# Patient Record
Sex: Female | Born: 1935 | Race: White | Hispanic: No | Marital: Single | Smoking: Never smoker
Health system: Southern US, Community
[De-identification: ages and names within clinical notes are randomized; demographics above are authoritative.]

## PROBLEM LIST (undated history)

## (undated) DIAGNOSIS — Z8679 Personal history of other diseases of the circulatory system: Secondary | ICD-10-CM

## (undated) DIAGNOSIS — I219 Acute myocardial infarction, unspecified: Secondary | ICD-10-CM

## (undated) DIAGNOSIS — I351 Nonrheumatic aortic (valve) insufficiency: Secondary | ICD-10-CM

## (undated) DIAGNOSIS — R600 Localized edema: Secondary | ICD-10-CM

## (undated) DIAGNOSIS — I872 Venous insufficiency (chronic) (peripheral): Secondary | ICD-10-CM

## (undated) DIAGNOSIS — F0280 Dementia in other diseases classified elsewhere without behavioral disturbance: Secondary | ICD-10-CM

## (undated) DIAGNOSIS — I480 Paroxysmal atrial fibrillation: Secondary | ICD-10-CM

## (undated) DIAGNOSIS — I442 Atrioventricular block, complete: Secondary | ICD-10-CM

## (undated) DIAGNOSIS — G309 Alzheimer's disease, unspecified: Secondary | ICD-10-CM

## (undated) DIAGNOSIS — S2231XA Fracture of one rib, right side, initial encounter for closed fracture: Secondary | ICD-10-CM

## (undated) HISTORY — DX: Personal history of other diseases of the circulatory system: Z86.79

## (undated) HISTORY — PX: PACEMAKER INSERTION: SHX728

## (undated) HISTORY — DX: Atrioventricular block, complete: I44.2

## (undated) HISTORY — DX: Venous insufficiency (chronic) (peripheral): I87.2

## (undated) HISTORY — DX: Localized edema: R60.0

## (undated) HISTORY — DX: Paroxysmal atrial fibrillation: I48.0

## (undated) HISTORY — DX: Dementia in other diseases classified elsewhere, unspecified severity, without behavioral disturbance, psychotic disturbance, mood disturbance, and anxiety: F02.80

## (undated) HISTORY — DX: Nonrheumatic aortic (valve) insufficiency: I35.1

## (undated) HISTORY — DX: Fracture of one rib, right side, initial encounter for closed fracture: S22.31XA

## (undated) HISTORY — PX: KNEE ARTHROSCOPY: SUR90

## (undated) HISTORY — PX: TOTAL SHOULDER REPLACEMENT: SUR1217

## (undated) HISTORY — DX: Acute myocardial infarction, unspecified: I21.9

## (undated) HISTORY — DX: Alzheimer's disease, unspecified: G30.9

---

## 2011-10-25 DIAGNOSIS — Z7901 Long term (current) use of anticoagulants: Secondary | ICD-10-CM | POA: Diagnosis not present

## 2011-10-25 DIAGNOSIS — I4891 Unspecified atrial fibrillation: Secondary | ICD-10-CM | POA: Diagnosis not present

## 2011-10-25 DIAGNOSIS — J310 Chronic rhinitis: Secondary | ICD-10-CM | POA: Diagnosis not present

## 2011-11-02 DIAGNOSIS — M25569 Pain in unspecified knee: Secondary | ICD-10-CM | POA: Diagnosis not present

## 2011-11-11 DIAGNOSIS — M1710 Unilateral primary osteoarthritis, unspecified knee: Secondary | ICD-10-CM | POA: Diagnosis not present

## 2011-11-14 DIAGNOSIS — Z95810 Presence of automatic (implantable) cardiac defibrillator: Secondary | ICD-10-CM | POA: Diagnosis not present

## 2011-11-14 DIAGNOSIS — I059 Rheumatic mitral valve disease, unspecified: Secondary | ICD-10-CM | POA: Diagnosis not present

## 2011-11-14 DIAGNOSIS — Z01810 Encounter for preprocedural cardiovascular examination: Secondary | ICD-10-CM | POA: Diagnosis not present

## 2011-11-14 DIAGNOSIS — I359 Nonrheumatic aortic valve disorder, unspecified: Secondary | ICD-10-CM | POA: Diagnosis not present

## 2011-11-14 DIAGNOSIS — I270 Primary pulmonary hypertension: Secondary | ICD-10-CM | POA: Diagnosis not present

## 2011-11-14 DIAGNOSIS — I10 Essential (primary) hypertension: Secondary | ICD-10-CM | POA: Diagnosis not present

## 2011-11-17 DIAGNOSIS — R0602 Shortness of breath: Secondary | ICD-10-CM | POA: Diagnosis not present

## 2011-11-22 DIAGNOSIS — Z7901 Long term (current) use of anticoagulants: Secondary | ICD-10-CM | POA: Diagnosis not present

## 2011-11-22 DIAGNOSIS — I4891 Unspecified atrial fibrillation: Secondary | ICD-10-CM | POA: Diagnosis not present

## 2011-11-24 DIAGNOSIS — Z95810 Presence of automatic (implantable) cardiac defibrillator: Secondary | ICD-10-CM | POA: Diagnosis not present

## 2011-11-24 DIAGNOSIS — Z7901 Long term (current) use of anticoagulants: Secondary | ICD-10-CM | POA: Diagnosis not present

## 2011-11-24 DIAGNOSIS — I4891 Unspecified atrial fibrillation: Secondary | ICD-10-CM | POA: Diagnosis not present

## 2011-11-24 DIAGNOSIS — Z01810 Encounter for preprocedural cardiovascular examination: Secondary | ICD-10-CM | POA: Diagnosis not present

## 2011-11-24 DIAGNOSIS — I428 Other cardiomyopathies: Secondary | ICD-10-CM | POA: Diagnosis not present

## 2012-01-03 DIAGNOSIS — I4891 Unspecified atrial fibrillation: Secondary | ICD-10-CM | POA: Diagnosis not present

## 2012-01-03 DIAGNOSIS — Z7901 Long term (current) use of anticoagulants: Secondary | ICD-10-CM | POA: Diagnosis not present

## 2012-01-27 DIAGNOSIS — Z95810 Presence of automatic (implantable) cardiac defibrillator: Secondary | ICD-10-CM | POA: Diagnosis not present

## 2012-02-17 DIAGNOSIS — J310 Chronic rhinitis: Secondary | ICD-10-CM | POA: Diagnosis not present

## 2012-03-22 DIAGNOSIS — R609 Edema, unspecified: Secondary | ICD-10-CM | POA: Diagnosis not present

## 2012-03-22 DIAGNOSIS — I517 Cardiomegaly: Secondary | ICD-10-CM | POA: Diagnosis not present

## 2012-03-22 DIAGNOSIS — Z7901 Long term (current) use of anticoagulants: Secondary | ICD-10-CM | POA: Diagnosis not present

## 2012-03-26 DIAGNOSIS — R609 Edema, unspecified: Secondary | ICD-10-CM | POA: Diagnosis not present

## 2012-03-26 DIAGNOSIS — I509 Heart failure, unspecified: Secondary | ICD-10-CM | POA: Diagnosis not present

## 2012-05-02 DIAGNOSIS — T7840XA Allergy, unspecified, initial encounter: Secondary | ICD-10-CM | POA: Diagnosis not present

## 2012-05-02 DIAGNOSIS — I509 Heart failure, unspecified: Secondary | ICD-10-CM | POA: Diagnosis not present

## 2012-05-02 DIAGNOSIS — M25519 Pain in unspecified shoulder: Secondary | ICD-10-CM | POA: Diagnosis not present

## 2012-05-02 DIAGNOSIS — Z7901 Long term (current) use of anticoagulants: Secondary | ICD-10-CM | POA: Diagnosis not present

## 2012-05-02 DIAGNOSIS — R609 Edema, unspecified: Secondary | ICD-10-CM | POA: Diagnosis not present

## 2012-05-02 DIAGNOSIS — M25569 Pain in unspecified knee: Secondary | ICD-10-CM | POA: Diagnosis not present

## 2012-05-17 DIAGNOSIS — K649 Unspecified hemorrhoids: Secondary | ICD-10-CM | POA: Diagnosis not present

## 2012-05-17 DIAGNOSIS — R197 Diarrhea, unspecified: Secondary | ICD-10-CM | POA: Diagnosis not present

## 2012-05-17 DIAGNOSIS — Z7901 Long term (current) use of anticoagulants: Secondary | ICD-10-CM | POA: Diagnosis not present

## 2012-05-23 DIAGNOSIS — I359 Nonrheumatic aortic valve disorder, unspecified: Secondary | ICD-10-CM | POA: Diagnosis not present

## 2012-05-23 DIAGNOSIS — I10 Essential (primary) hypertension: Secondary | ICD-10-CM | POA: Diagnosis not present

## 2012-05-23 DIAGNOSIS — I4891 Unspecified atrial fibrillation: Secondary | ICD-10-CM | POA: Diagnosis not present

## 2012-05-23 DIAGNOSIS — Z95810 Presence of automatic (implantable) cardiac defibrillator: Secondary | ICD-10-CM | POA: Diagnosis not present

## 2012-05-23 DIAGNOSIS — I428 Other cardiomyopathies: Secondary | ICD-10-CM | POA: Diagnosis not present

## 2012-05-30 DIAGNOSIS — R6889 Other general symptoms and signs: Secondary | ICD-10-CM | POA: Diagnosis not present

## 2012-05-30 DIAGNOSIS — J8410 Pulmonary fibrosis, unspecified: Secondary | ICD-10-CM | POA: Diagnosis not present

## 2012-05-30 DIAGNOSIS — M79609 Pain in unspecified limb: Secondary | ICD-10-CM | POA: Diagnosis not present

## 2012-05-30 DIAGNOSIS — M1710 Unilateral primary osteoarthritis, unspecified knee: Secondary | ICD-10-CM | POA: Diagnosis not present

## 2012-05-30 DIAGNOSIS — M949 Disorder of cartilage, unspecified: Secondary | ICD-10-CM | POA: Diagnosis not present

## 2012-05-30 DIAGNOSIS — I517 Cardiomegaly: Secondary | ICD-10-CM | POA: Diagnosis not present

## 2012-05-30 DIAGNOSIS — M899 Disorder of bone, unspecified: Secondary | ICD-10-CM | POA: Diagnosis not present

## 2012-05-30 DIAGNOSIS — Z01818 Encounter for other preprocedural examination: Secondary | ICD-10-CM | POA: Diagnosis not present

## 2012-05-30 DIAGNOSIS — R9389 Abnormal findings on diagnostic imaging of other specified body structures: Secondary | ICD-10-CM | POA: Diagnosis not present

## 2012-06-14 DIAGNOSIS — F0391 Unspecified dementia with behavioral disturbance: Secondary | ICD-10-CM | POA: Diagnosis not present

## 2012-06-14 DIAGNOSIS — Z5189 Encounter for other specified aftercare: Secondary | ICD-10-CM | POA: Diagnosis not present

## 2012-06-14 DIAGNOSIS — M1710 Unilateral primary osteoarthritis, unspecified knee: Secondary | ICD-10-CM | POA: Diagnosis present

## 2012-06-14 DIAGNOSIS — Z471 Aftercare following joint replacement surgery: Secondary | ICD-10-CM | POA: Diagnosis not present

## 2012-06-14 DIAGNOSIS — Z299 Encounter for prophylactic measures, unspecified: Secondary | ICD-10-CM | POA: Diagnosis not present

## 2012-06-14 DIAGNOSIS — D62 Acute posthemorrhagic anemia: Secondary | ICD-10-CM | POA: Diagnosis not present

## 2012-06-14 DIAGNOSIS — F0390 Unspecified dementia without behavioral disturbance: Secondary | ICD-10-CM | POA: Diagnosis present

## 2012-06-14 DIAGNOSIS — I4891 Unspecified atrial fibrillation: Secondary | ICD-10-CM | POA: Diagnosis present

## 2012-06-14 DIAGNOSIS — IMO0002 Reserved for concepts with insufficient information to code with codable children: Secondary | ICD-10-CM | POA: Diagnosis not present

## 2012-06-14 DIAGNOSIS — G4700 Insomnia, unspecified: Secondary | ICD-10-CM | POA: Diagnosis present

## 2012-06-14 DIAGNOSIS — Z882 Allergy status to sulfonamides status: Secondary | ICD-10-CM | POA: Diagnosis not present

## 2012-06-14 DIAGNOSIS — R413 Other amnesia: Secondary | ICD-10-CM | POA: Diagnosis not present

## 2012-06-14 DIAGNOSIS — Z7901 Long term (current) use of anticoagulants: Secondary | ICD-10-CM | POA: Diagnosis not present

## 2012-06-14 DIAGNOSIS — K5900 Constipation, unspecified: Secondary | ICD-10-CM | POA: Diagnosis not present

## 2012-06-14 DIAGNOSIS — M25569 Pain in unspecified knee: Secondary | ICD-10-CM | POA: Diagnosis not present

## 2012-06-14 DIAGNOSIS — Z95810 Presence of automatic (implantable) cardiac defibrillator: Secondary | ICD-10-CM | POA: Diagnosis not present

## 2012-06-14 DIAGNOSIS — Z888 Allergy status to other drugs, medicaments and biological substances status: Secondary | ICD-10-CM | POA: Diagnosis not present

## 2012-06-14 DIAGNOSIS — Z4789 Encounter for other orthopedic aftercare: Secondary | ICD-10-CM | POA: Diagnosis not present

## 2012-06-14 DIAGNOSIS — I10 Essential (primary) hypertension: Secondary | ICD-10-CM | POA: Diagnosis present

## 2012-06-14 DIAGNOSIS — M949 Disorder of cartilage, unspecified: Secondary | ICD-10-CM | POA: Diagnosis present

## 2012-06-14 DIAGNOSIS — Z96659 Presence of unspecified artificial knee joint: Secondary | ICD-10-CM | POA: Diagnosis not present

## 2012-06-14 DIAGNOSIS — F329 Major depressive disorder, single episode, unspecified: Secondary | ICD-10-CM | POA: Diagnosis not present

## 2012-06-14 DIAGNOSIS — G8929 Other chronic pain: Secondary | ICD-10-CM | POA: Diagnosis not present

## 2012-06-14 DIAGNOSIS — I509 Heart failure, unspecified: Secondary | ICD-10-CM | POA: Diagnosis present

## 2012-06-22 DIAGNOSIS — Z471 Aftercare following joint replacement surgery: Secondary | ICD-10-CM | POA: Diagnosis not present

## 2012-06-22 DIAGNOSIS — Z96659 Presence of unspecified artificial knee joint: Secondary | ICD-10-CM | POA: Diagnosis not present

## 2012-06-26 DIAGNOSIS — I10 Essential (primary) hypertension: Secondary | ICD-10-CM | POA: Diagnosis not present

## 2012-06-26 DIAGNOSIS — R5381 Other malaise: Secondary | ICD-10-CM | POA: Diagnosis not present

## 2012-06-26 DIAGNOSIS — I4891 Unspecified atrial fibrillation: Secondary | ICD-10-CM | POA: Diagnosis not present

## 2012-06-26 DIAGNOSIS — G4700 Insomnia, unspecified: Secondary | ICD-10-CM | POA: Diagnosis not present

## 2012-06-26 DIAGNOSIS — F329 Major depressive disorder, single episode, unspecified: Secondary | ICD-10-CM | POA: Diagnosis not present

## 2012-06-26 DIAGNOSIS — K5900 Constipation, unspecified: Secondary | ICD-10-CM | POA: Diagnosis not present

## 2012-06-26 DIAGNOSIS — F0391 Unspecified dementia with behavioral disturbance: Secondary | ICD-10-CM | POA: Diagnosis not present

## 2012-06-26 DIAGNOSIS — Z5189 Encounter for other specified aftercare: Secondary | ICD-10-CM | POA: Diagnosis not present

## 2012-06-26 DIAGNOSIS — R413 Other amnesia: Secondary | ICD-10-CM | POA: Diagnosis not present

## 2012-06-26 DIAGNOSIS — Z4789 Encounter for other orthopedic aftercare: Secondary | ICD-10-CM | POA: Diagnosis not present

## 2012-06-26 DIAGNOSIS — F0390 Unspecified dementia without behavioral disturbance: Secondary | ICD-10-CM | POA: Diagnosis not present

## 2012-06-26 DIAGNOSIS — G8929 Other chronic pain: Secondary | ICD-10-CM | POA: Diagnosis not present

## 2012-06-26 DIAGNOSIS — Z471 Aftercare following joint replacement surgery: Secondary | ICD-10-CM | POA: Diagnosis not present

## 2012-06-26 DIAGNOSIS — I509 Heart failure, unspecified: Secondary | ICD-10-CM | POA: Diagnosis not present

## 2012-06-26 DIAGNOSIS — M1990 Unspecified osteoarthritis, unspecified site: Secondary | ICD-10-CM | POA: Diagnosis not present

## 2012-06-26 DIAGNOSIS — Z299 Encounter for prophylactic measures, unspecified: Secondary | ICD-10-CM | POA: Diagnosis not present

## 2012-06-26 DIAGNOSIS — Z7901 Long term (current) use of anticoagulants: Secondary | ICD-10-CM | POA: Diagnosis not present

## 2012-06-27 DIAGNOSIS — M1990 Unspecified osteoarthritis, unspecified site: Secondary | ICD-10-CM | POA: Diagnosis not present

## 2012-06-27 DIAGNOSIS — I509 Heart failure, unspecified: Secondary | ICD-10-CM | POA: Diagnosis not present

## 2012-06-27 DIAGNOSIS — Z471 Aftercare following joint replacement surgery: Secondary | ICD-10-CM | POA: Diagnosis not present

## 2012-06-27 DIAGNOSIS — I4891 Unspecified atrial fibrillation: Secondary | ICD-10-CM | POA: Diagnosis not present

## 2012-06-28 DIAGNOSIS — Z471 Aftercare following joint replacement surgery: Secondary | ICD-10-CM | POA: Diagnosis not present

## 2012-06-28 DIAGNOSIS — Z7901 Long term (current) use of anticoagulants: Secondary | ICD-10-CM | POA: Diagnosis not present

## 2012-07-03 DIAGNOSIS — Z7901 Long term (current) use of anticoagulants: Secondary | ICD-10-CM | POA: Diagnosis not present

## 2012-07-03 DIAGNOSIS — Z471 Aftercare following joint replacement surgery: Secondary | ICD-10-CM | POA: Diagnosis not present

## 2012-07-10 DIAGNOSIS — F0390 Unspecified dementia without behavioral disturbance: Secondary | ICD-10-CM | POA: Diagnosis not present

## 2012-07-10 DIAGNOSIS — R5381 Other malaise: Secondary | ICD-10-CM | POA: Diagnosis not present

## 2012-07-10 DIAGNOSIS — I10 Essential (primary) hypertension: Secondary | ICD-10-CM | POA: Diagnosis not present

## 2012-07-10 DIAGNOSIS — Z471 Aftercare following joint replacement surgery: Secondary | ICD-10-CM | POA: Diagnosis not present

## 2012-07-17 DIAGNOSIS — F0390 Unspecified dementia without behavioral disturbance: Secondary | ICD-10-CM | POA: Diagnosis not present

## 2012-07-17 DIAGNOSIS — Z471 Aftercare following joint replacement surgery: Secondary | ICD-10-CM | POA: Diagnosis not present

## 2012-07-17 DIAGNOSIS — I10 Essential (primary) hypertension: Secondary | ICD-10-CM | POA: Diagnosis not present

## 2012-07-17 DIAGNOSIS — R5381 Other malaise: Secondary | ICD-10-CM | POA: Diagnosis not present

## 2012-07-19 DIAGNOSIS — Z471 Aftercare following joint replacement surgery: Secondary | ICD-10-CM | POA: Diagnosis not present

## 2012-07-19 DIAGNOSIS — F0390 Unspecified dementia without behavioral disturbance: Secondary | ICD-10-CM | POA: Diagnosis not present

## 2012-07-19 DIAGNOSIS — Z95810 Presence of automatic (implantable) cardiac defibrillator: Secondary | ICD-10-CM | POA: Diagnosis not present

## 2012-07-19 DIAGNOSIS — Z96659 Presence of unspecified artificial knee joint: Secondary | ICD-10-CM | POA: Diagnosis not present

## 2012-07-19 DIAGNOSIS — I10 Essential (primary) hypertension: Secondary | ICD-10-CM | POA: Diagnosis not present

## 2012-07-19 DIAGNOSIS — Z950 Presence of cardiac pacemaker: Secondary | ICD-10-CM | POA: Diagnosis not present

## 2012-07-23 DIAGNOSIS — Z95810 Presence of automatic (implantable) cardiac defibrillator: Secondary | ICD-10-CM | POA: Diagnosis not present

## 2012-07-23 DIAGNOSIS — Z950 Presence of cardiac pacemaker: Secondary | ICD-10-CM | POA: Diagnosis not present

## 2012-07-23 DIAGNOSIS — Z471 Aftercare following joint replacement surgery: Secondary | ICD-10-CM | POA: Diagnosis not present

## 2012-07-23 DIAGNOSIS — I4891 Unspecified atrial fibrillation: Secondary | ICD-10-CM | POA: Diagnosis not present

## 2012-07-23 DIAGNOSIS — Z96659 Presence of unspecified artificial knee joint: Secondary | ICD-10-CM | POA: Diagnosis not present

## 2012-07-23 DIAGNOSIS — I10 Essential (primary) hypertension: Secondary | ICD-10-CM | POA: Diagnosis not present

## 2012-07-23 DIAGNOSIS — I509 Heart failure, unspecified: Secondary | ICD-10-CM | POA: Diagnosis not present

## 2012-07-23 DIAGNOSIS — F0390 Unspecified dementia without behavioral disturbance: Secondary | ICD-10-CM | POA: Diagnosis not present

## 2012-07-25 DIAGNOSIS — Z471 Aftercare following joint replacement surgery: Secondary | ICD-10-CM | POA: Diagnosis not present

## 2012-07-25 DIAGNOSIS — F0390 Unspecified dementia without behavioral disturbance: Secondary | ICD-10-CM | POA: Diagnosis not present

## 2012-07-25 DIAGNOSIS — Z96659 Presence of unspecified artificial knee joint: Secondary | ICD-10-CM | POA: Diagnosis not present

## 2012-07-25 DIAGNOSIS — I10 Essential (primary) hypertension: Secondary | ICD-10-CM | POA: Diagnosis not present

## 2012-07-25 DIAGNOSIS — Z950 Presence of cardiac pacemaker: Secondary | ICD-10-CM | POA: Diagnosis not present

## 2012-07-25 DIAGNOSIS — Z95810 Presence of automatic (implantable) cardiac defibrillator: Secondary | ICD-10-CM | POA: Diagnosis not present

## 2012-07-26 DIAGNOSIS — F0390 Unspecified dementia without behavioral disturbance: Secondary | ICD-10-CM | POA: Diagnosis not present

## 2012-07-26 DIAGNOSIS — Z96659 Presence of unspecified artificial knee joint: Secondary | ICD-10-CM | POA: Diagnosis not present

## 2012-07-26 DIAGNOSIS — Z471 Aftercare following joint replacement surgery: Secondary | ICD-10-CM | POA: Diagnosis not present

## 2012-07-26 DIAGNOSIS — Z95810 Presence of automatic (implantable) cardiac defibrillator: Secondary | ICD-10-CM | POA: Diagnosis not present

## 2012-07-26 DIAGNOSIS — Z950 Presence of cardiac pacemaker: Secondary | ICD-10-CM | POA: Diagnosis not present

## 2012-07-26 DIAGNOSIS — I10 Essential (primary) hypertension: Secondary | ICD-10-CM | POA: Diagnosis not present

## 2012-07-30 DIAGNOSIS — Z95810 Presence of automatic (implantable) cardiac defibrillator: Secondary | ICD-10-CM | POA: Diagnosis not present

## 2012-07-30 DIAGNOSIS — Z950 Presence of cardiac pacemaker: Secondary | ICD-10-CM | POA: Diagnosis not present

## 2012-07-30 DIAGNOSIS — Z96659 Presence of unspecified artificial knee joint: Secondary | ICD-10-CM | POA: Diagnosis not present

## 2012-07-30 DIAGNOSIS — Z471 Aftercare following joint replacement surgery: Secondary | ICD-10-CM | POA: Diagnosis not present

## 2012-07-30 DIAGNOSIS — I10 Essential (primary) hypertension: Secondary | ICD-10-CM | POA: Diagnosis not present

## 2012-07-30 DIAGNOSIS — F0390 Unspecified dementia without behavioral disturbance: Secondary | ICD-10-CM | POA: Diagnosis not present

## 2012-08-01 DIAGNOSIS — Z96659 Presence of unspecified artificial knee joint: Secondary | ICD-10-CM | POA: Diagnosis not present

## 2012-08-02 DIAGNOSIS — I10 Essential (primary) hypertension: Secondary | ICD-10-CM | POA: Diagnosis not present

## 2012-08-02 DIAGNOSIS — Z95810 Presence of automatic (implantable) cardiac defibrillator: Secondary | ICD-10-CM | POA: Diagnosis not present

## 2012-08-02 DIAGNOSIS — F0390 Unspecified dementia without behavioral disturbance: Secondary | ICD-10-CM | POA: Diagnosis not present

## 2012-08-02 DIAGNOSIS — Z950 Presence of cardiac pacemaker: Secondary | ICD-10-CM | POA: Diagnosis not present

## 2012-08-02 DIAGNOSIS — Z471 Aftercare following joint replacement surgery: Secondary | ICD-10-CM | POA: Diagnosis not present

## 2012-08-02 DIAGNOSIS — Z96659 Presence of unspecified artificial knee joint: Secondary | ICD-10-CM | POA: Diagnosis not present

## 2012-08-06 DIAGNOSIS — F0390 Unspecified dementia without behavioral disturbance: Secondary | ICD-10-CM | POA: Diagnosis not present

## 2012-08-06 DIAGNOSIS — Z95810 Presence of automatic (implantable) cardiac defibrillator: Secondary | ICD-10-CM | POA: Diagnosis not present

## 2012-08-06 DIAGNOSIS — I10 Essential (primary) hypertension: Secondary | ICD-10-CM | POA: Diagnosis not present

## 2012-08-06 DIAGNOSIS — Z471 Aftercare following joint replacement surgery: Secondary | ICD-10-CM | POA: Diagnosis not present

## 2012-08-06 DIAGNOSIS — Z950 Presence of cardiac pacemaker: Secondary | ICD-10-CM | POA: Diagnosis not present

## 2012-08-06 DIAGNOSIS — Z96659 Presence of unspecified artificial knee joint: Secondary | ICD-10-CM | POA: Diagnosis not present

## 2012-08-15 DIAGNOSIS — Z23 Encounter for immunization: Secondary | ICD-10-CM | POA: Diagnosis not present

## 2012-08-15 DIAGNOSIS — R269 Unspecified abnormalities of gait and mobility: Secondary | ICD-10-CM | POA: Diagnosis not present

## 2012-08-15 DIAGNOSIS — M2550 Pain in unspecified joint: Secondary | ICD-10-CM | POA: Diagnosis not present

## 2012-08-30 DIAGNOSIS — I059 Rheumatic mitral valve disease, unspecified: Secondary | ICD-10-CM | POA: Diagnosis not present

## 2012-08-30 DIAGNOSIS — I428 Other cardiomyopathies: Secondary | ICD-10-CM | POA: Diagnosis not present

## 2012-08-30 DIAGNOSIS — I359 Nonrheumatic aortic valve disorder, unspecified: Secondary | ICD-10-CM | POA: Diagnosis not present

## 2012-08-30 DIAGNOSIS — I10 Essential (primary) hypertension: Secondary | ICD-10-CM | POA: Diagnosis not present

## 2012-08-30 DIAGNOSIS — Z95810 Presence of automatic (implantable) cardiac defibrillator: Secondary | ICD-10-CM | POA: Diagnosis not present

## 2012-08-30 DIAGNOSIS — I4891 Unspecified atrial fibrillation: Secondary | ICD-10-CM | POA: Diagnosis not present

## 2012-09-03 DIAGNOSIS — W11XXXA Fall on and from ladder, initial encounter: Secondary | ICD-10-CM | POA: Diagnosis not present

## 2012-09-03 DIAGNOSIS — Z7901 Long term (current) use of anticoagulants: Secondary | ICD-10-CM | POA: Diagnosis not present

## 2012-09-03 DIAGNOSIS — I998 Other disorder of circulatory system: Secondary | ICD-10-CM | POA: Diagnosis not present

## 2012-09-03 DIAGNOSIS — S5000XA Contusion of unspecified elbow, initial encounter: Secondary | ICD-10-CM | POA: Diagnosis not present

## 2012-09-03 DIAGNOSIS — S300XXA Contusion of lower back and pelvis, initial encounter: Secondary | ICD-10-CM | POA: Diagnosis not present

## 2012-09-21 DIAGNOSIS — F0280 Dementia in other diseases classified elsewhere without behavioral disturbance: Secondary | ICD-10-CM | POA: Diagnosis not present

## 2012-09-21 DIAGNOSIS — R609 Edema, unspecified: Secondary | ICD-10-CM | POA: Diagnosis not present

## 2012-09-21 DIAGNOSIS — G309 Alzheimer's disease, unspecified: Secondary | ICD-10-CM | POA: Diagnosis not present

## 2012-09-21 DIAGNOSIS — I8310 Varicose veins of unspecified lower extremity with inflammation: Secondary | ICD-10-CM | POA: Diagnosis not present

## 2012-09-21 DIAGNOSIS — I4891 Unspecified atrial fibrillation: Secondary | ICD-10-CM | POA: Diagnosis not present

## 2012-09-28 DIAGNOSIS — Z79899 Other long term (current) drug therapy: Secondary | ICD-10-CM | POA: Diagnosis not present

## 2012-09-28 DIAGNOSIS — R609 Edema, unspecified: Secondary | ICD-10-CM | POA: Diagnosis not present

## 2012-09-28 DIAGNOSIS — I4891 Unspecified atrial fibrillation: Secondary | ICD-10-CM | POA: Diagnosis not present

## 2012-09-28 DIAGNOSIS — Z5181 Encounter for therapeutic drug level monitoring: Secondary | ICD-10-CM | POA: Diagnosis not present

## 2012-09-28 DIAGNOSIS — E785 Hyperlipidemia, unspecified: Secondary | ICD-10-CM | POA: Diagnosis not present

## 2012-09-28 DIAGNOSIS — I8390 Asymptomatic varicose veins of unspecified lower extremity: Secondary | ICD-10-CM | POA: Diagnosis not present

## 2012-09-28 DIAGNOSIS — Z95810 Presence of automatic (implantable) cardiac defibrillator: Secondary | ICD-10-CM | POA: Diagnosis not present

## 2012-09-28 DIAGNOSIS — Z950 Presence of cardiac pacemaker: Secondary | ICD-10-CM | POA: Diagnosis not present

## 2012-10-01 DIAGNOSIS — E785 Hyperlipidemia, unspecified: Secondary | ICD-10-CM | POA: Diagnosis not present

## 2012-10-01 DIAGNOSIS — Z79899 Other long term (current) drug therapy: Secondary | ICD-10-CM | POA: Diagnosis not present

## 2012-10-01 DIAGNOSIS — Z7901 Long term (current) use of anticoagulants: Secondary | ICD-10-CM | POA: Diagnosis not present

## 2012-10-01 DIAGNOSIS — R609 Edema, unspecified: Secondary | ICD-10-CM | POA: Diagnosis not present

## 2012-10-09 DIAGNOSIS — I4891 Unspecified atrial fibrillation: Secondary | ICD-10-CM | POA: Diagnosis not present

## 2012-10-09 DIAGNOSIS — I214 Non-ST elevation (NSTEMI) myocardial infarction: Secondary | ICD-10-CM | POA: Diagnosis not present

## 2012-10-09 DIAGNOSIS — Z7901 Long term (current) use of anticoagulants: Secondary | ICD-10-CM | POA: Diagnosis not present

## 2012-10-09 DIAGNOSIS — I5020 Unspecified systolic (congestive) heart failure: Secondary | ICD-10-CM | POA: Diagnosis not present

## 2012-10-09 DIAGNOSIS — Z95810 Presence of automatic (implantable) cardiac defibrillator: Secondary | ICD-10-CM | POA: Diagnosis not present

## 2012-10-15 DIAGNOSIS — Z7901 Long term (current) use of anticoagulants: Secondary | ICD-10-CM | POA: Diagnosis not present

## 2012-10-24 ENCOUNTER — Encounter: Payer: Self-pay | Admitting: Internal Medicine

## 2012-10-25 ENCOUNTER — Institutional Professional Consult (permissible substitution): Payer: Self-pay | Admitting: Internal Medicine

## 2012-10-29 DIAGNOSIS — Z7901 Long term (current) use of anticoagulants: Secondary | ICD-10-CM | POA: Diagnosis not present

## 2012-10-29 DIAGNOSIS — I4891 Unspecified atrial fibrillation: Secondary | ICD-10-CM | POA: Diagnosis not present

## 2012-11-05 DIAGNOSIS — Z7901 Long term (current) use of anticoagulants: Secondary | ICD-10-CM | POA: Diagnosis not present

## 2012-11-19 ENCOUNTER — Encounter: Payer: Self-pay | Admitting: Internal Medicine

## 2012-12-03 DIAGNOSIS — I4891 Unspecified atrial fibrillation: Secondary | ICD-10-CM | POA: Diagnosis not present

## 2012-12-03 DIAGNOSIS — Z95810 Presence of automatic (implantable) cardiac defibrillator: Secondary | ICD-10-CM | POA: Diagnosis not present

## 2012-12-03 DIAGNOSIS — Z7901 Long term (current) use of anticoagulants: Secondary | ICD-10-CM | POA: Diagnosis not present

## 2012-12-03 DIAGNOSIS — I5020 Unspecified systolic (congestive) heart failure: Secondary | ICD-10-CM | POA: Diagnosis not present

## 2013-01-21 DIAGNOSIS — Z7901 Long term (current) use of anticoagulants: Secondary | ICD-10-CM | POA: Diagnosis not present

## 2013-01-21 DIAGNOSIS — L989 Disorder of the skin and subcutaneous tissue, unspecified: Secondary | ICD-10-CM | POA: Diagnosis not present

## 2013-01-31 DIAGNOSIS — L820 Inflamed seborrheic keratosis: Secondary | ICD-10-CM | POA: Diagnosis not present

## 2013-01-31 DIAGNOSIS — D235 Other benign neoplasm of skin of trunk: Secondary | ICD-10-CM | POA: Diagnosis not present

## 2013-02-18 DIAGNOSIS — Z7901 Long term (current) use of anticoagulants: Secondary | ICD-10-CM | POA: Diagnosis not present

## 2013-04-22 DIAGNOSIS — G4700 Insomnia, unspecified: Secondary | ICD-10-CM | POA: Diagnosis not present

## 2013-04-22 DIAGNOSIS — L02419 Cutaneous abscess of limb, unspecified: Secondary | ICD-10-CM | POA: Diagnosis not present

## 2013-04-22 DIAGNOSIS — Z7901 Long term (current) use of anticoagulants: Secondary | ICD-10-CM | POA: Diagnosis not present

## 2013-04-25 DIAGNOSIS — I4891 Unspecified atrial fibrillation: Secondary | ICD-10-CM | POA: Diagnosis not present

## 2013-04-25 DIAGNOSIS — I5020 Unspecified systolic (congestive) heart failure: Secondary | ICD-10-CM | POA: Diagnosis not present

## 2013-04-25 DIAGNOSIS — Z5181 Encounter for therapeutic drug level monitoring: Secondary | ICD-10-CM | POA: Diagnosis not present

## 2013-04-25 DIAGNOSIS — Z95810 Presence of automatic (implantable) cardiac defibrillator: Secondary | ICD-10-CM | POA: Diagnosis not present

## 2013-04-26 DIAGNOSIS — Z7901 Long term (current) use of anticoagulants: Secondary | ICD-10-CM | POA: Diagnosis not present

## 2013-04-26 DIAGNOSIS — L02419 Cutaneous abscess of limb, unspecified: Secondary | ICD-10-CM | POA: Diagnosis not present

## 2013-05-02 DIAGNOSIS — Z95810 Presence of automatic (implantable) cardiac defibrillator: Secondary | ICD-10-CM | POA: Diagnosis not present

## 2013-05-02 DIAGNOSIS — Z7901 Long term (current) use of anticoagulants: Secondary | ICD-10-CM | POA: Diagnosis not present

## 2013-05-02 DIAGNOSIS — I4891 Unspecified atrial fibrillation: Secondary | ICD-10-CM | POA: Diagnosis not present

## 2013-05-02 DIAGNOSIS — I5020 Unspecified systolic (congestive) heart failure: Secondary | ICD-10-CM | POA: Diagnosis not present

## 2013-05-15 ENCOUNTER — Ambulatory Visit (INDEPENDENT_AMBULATORY_CARE_PROVIDER_SITE_OTHER): Payer: 59 | Admitting: *Deleted

## 2013-05-15 ENCOUNTER — Encounter: Payer: 59 | Admitting: Internal Medicine

## 2013-05-15 DIAGNOSIS — I4891 Unspecified atrial fibrillation: Secondary | ICD-10-CM

## 2013-05-15 DIAGNOSIS — I428 Other cardiomyopathies: Secondary | ICD-10-CM

## 2013-05-20 DIAGNOSIS — Z7901 Long term (current) use of anticoagulants: Secondary | ICD-10-CM | POA: Diagnosis not present

## 2013-05-20 LAB — ICD DEVICE OBSERVATION
AL AMPLITUDE: 0.5 mv
ATRIAL PACING ICD: 99 pct
CHARGE TIME: 9.6 s
LV LEAD AMPLITUDE: 25 mv
LV LEAD THRESHOLD: 0.5 V
RV LEAD AMPLITUDE: 25 mv
RV LEAD IMPEDENCE ICD: 557 Ohm
RV LEAD THRESHOLD: 0.7 V

## 2013-05-20 NOTE — Progress Notes (Signed)
icd check in clinic. Pt rescheduled appointment with [facility] for 06-07-13. Normal device function. Battery longevity 4 years. Episodes of AF. + Warfarin.  Follow up with [facility] 06-07-13.

## 2013-06-07 ENCOUNTER — Encounter: Payer: 59 | Admitting: Internal Medicine

## 2013-07-02 DIAGNOSIS — Z7901 Long term (current) use of anticoagulants: Secondary | ICD-10-CM | POA: Diagnosis not present

## 2013-07-11 ENCOUNTER — Telehealth: Payer: Self-pay | Admitting: Internal Medicine

## 2013-07-11 ENCOUNTER — Encounter: Payer: Self-pay | Admitting: Internal Medicine

## 2013-07-11 ENCOUNTER — Ambulatory Visit (INDEPENDENT_AMBULATORY_CARE_PROVIDER_SITE_OTHER): Payer: Medicare Other | Admitting: Internal Medicine

## 2013-07-11 VITALS — BP 136/66 | HR 75 | Ht 62.0 in | Wt 148.0 lb

## 2013-07-11 DIAGNOSIS — I5022 Chronic systolic (congestive) heart failure: Secondary | ICD-10-CM | POA: Diagnosis not present

## 2013-07-11 DIAGNOSIS — I4891 Unspecified atrial fibrillation: Secondary | ICD-10-CM | POA: Diagnosis not present

## 2013-07-11 DIAGNOSIS — F0390 Unspecified dementia without behavioral disturbance: Secondary | ICD-10-CM

## 2013-07-11 DIAGNOSIS — I442 Atrioventricular block, complete: Secondary | ICD-10-CM | POA: Diagnosis not present

## 2013-07-11 DIAGNOSIS — Z95810 Presence of automatic (implantable) cardiac defibrillator: Secondary | ICD-10-CM

## 2013-07-11 NOTE — Assessment & Plan Note (Signed)
Her heart failure symptoms are class II. She'll continue her current medical therapy. It is unclear why she is not on an ACE inhibitor or an ARB drug, I will investigate,  Though I suspect it is related to renal insufficiency.

## 2013-07-11 NOTE — Telephone Encounter (Signed)
New Problem  Pt recently received the phone device// was taught how to use it// states that it is not working/// it is presenting an orange and yellow flashing light. Please advise.

## 2013-07-11 NOTE — Patient Instructions (Signed)
Your physician wants you to follow-up in: 12 months with Dr. Taylor. You will receive a reminder letter in the mail two months in advance. If you don't receive a letter, please call our office to schedule the follow-up appointment.    

## 2013-07-11 NOTE — Assessment & Plan Note (Signed)
Her [employer] biventricular ICD is working normally. We'll plan to recheck in several months. 

## 2013-07-11 NOTE — Progress Notes (Signed)
      HPI Rhonda Smith is referred today for ongoing evaluation and management of her ICD and atrial fibrillation. She is a very pleasant 79 year old woman with a history of chronic systolic heart failure, complete heart block, biventricular ICD implantation, and Alzheimer's dementia. She denies any recent ICD shocks. She has class II heart failure symptoms. Her memory is fair. Her niece is with her today. She has a [employer] ICD implanted in 2009. She has been on chronic anticoagulation with Coumadin. She denies chest pain, syncope, and has minimal dyspnea. She is fairly sedentary. She has chronic peripheral edema. Allergies  Allergen Reactions  . Sulfa Antibiotics      Current Outpatient Prescriptions  Medication Sig Dispense Refill  . clotrimazole-betamethasone (LOTRISONE) cream Apply topically 2 (two) times daily.      . furosemide (LASIX) 20 MG tablet Take 20 mg by mouth as needed.       . metoprolol succinate (TOPROL-XL) 25 MG 24 hr tablet Take 25 mg by mouth daily.      Marland Kitchen warfarin (COUMADIN) 1 MG tablet Take 1 mg by mouth as directed.      . warfarin (COUMADIN) 4 MG tablet Take 4 mg by mouth daily.       No current facility-administered medications for this visit.     Past Medical History  Diagnosis Date  . Arrhythmia     afib  . Edema leg   . Alzheimer's disease   . Stasis dermatitis     ROS:   All systems reviewed and negative except as noted in the HPI.   Past Surgical History  Procedure Laterality Date  . Knee arthroscopy      right  . Pacemaker insertion    . Total shoulder replacement       No family history on file.   History   Social History  . Marital Status: Single    Spouse Name: N/A    Number of Children: N/A  . Years of Education: N/A   Occupational History  . Not on file.   Social History Main Topics  . Smoking status: Never Smoker   . Smokeless tobacco: Not on file  . Alcohol Use:   . Drug Use:   . Sexual  Activity:    Other Topics Concern  . Not on file   Social History Narrative  . No narrative on file     BP 136/66  Pulse 75  Ht 5\' 2"  (1.575 m)  Wt 148 lb (67.132 kg)  BMI 27.06 kg/m2  Physical Exam:  Well appearing  Elderly woman, NAD HEENT: Unremarkable Neck:  7 cm JVD, no thyromegally Back:  No CVA tenderness Lungs:  Clear with no wheezes, rales, or rhonchi. HEART:  Regular rate rhythm, no murmurs, no rubs, no clicks Abd:  soft, positive bowel sounds, no organomegally, no rebound, no guarding Ext:  2 plus pulses, no edema, no cyanosis, no clubbing Skin:  No rashes no nodules Neuro:  CN II through XII intact, motor grossly intact  EKG - atrial fibrillation with biventricular pacing  DEVICE  Normal device function.  See PaceArt for details.   Assess/Plan:

## 2013-07-11 NOTE — Assessment & Plan Note (Signed)
Her ventricular rates are well controlled. At this point she will continue systemic anticoagulation. There is no indication for cardioversion.

## 2013-07-15 NOTE — Telephone Encounter (Signed)
Pt's contact, Loistine Simas, will contact me tomorrow with her serial number on [employer] home monitoring so I can update her registration.

## 2013-07-18 NOTE — Telephone Encounter (Signed)
LMOVM to call my direct line.

## 2013-07-19 ENCOUNTER — Encounter: Payer: Self-pay | Admitting: Internal Medicine

## 2013-07-22 ENCOUNTER — Telehealth: Payer: Self-pay | Admitting: *Deleted

## 2013-07-23 NOTE — Telephone Encounter (Signed)
Note/chart not intended w/ this encounter

## 2013-08-08 DIAGNOSIS — I4891 Unspecified atrial fibrillation: Secondary | ICD-10-CM | POA: Diagnosis not present

## 2013-08-08 DIAGNOSIS — Z7901 Long term (current) use of anticoagulants: Secondary | ICD-10-CM | POA: Diagnosis not present

## 2013-08-12 ENCOUNTER — Encounter: Payer: Self-pay | Admitting: Cardiology

## 2013-09-27 DIAGNOSIS — Z7901 Long term (current) use of anticoagulants: Secondary | ICD-10-CM | POA: Diagnosis not present

## 2013-09-27 DIAGNOSIS — I4891 Unspecified atrial fibrillation: Secondary | ICD-10-CM | POA: Diagnosis not present

## 2013-10-25 DIAGNOSIS — I4891 Unspecified atrial fibrillation: Secondary | ICD-10-CM | POA: Diagnosis not present

## 2013-10-25 DIAGNOSIS — Z7901 Long term (current) use of anticoagulants: Secondary | ICD-10-CM | POA: Diagnosis not present

## 2013-10-28 ENCOUNTER — Ambulatory Visit: Payer: 59 | Admitting: Cardiology

## 2013-10-29 ENCOUNTER — Ambulatory Visit: Payer: Medicare Other | Admitting: Cardiology

## 2013-11-05 ENCOUNTER — Ambulatory Visit (INDEPENDENT_AMBULATORY_CARE_PROVIDER_SITE_OTHER): Payer: Medicare Other | Admitting: Cardiology

## 2013-11-05 ENCOUNTER — Ambulatory Visit (INDEPENDENT_AMBULATORY_CARE_PROVIDER_SITE_OTHER): Payer: Medicare Other | Admitting: *Deleted

## 2013-11-05 ENCOUNTER — Encounter: Payer: Self-pay | Admitting: Cardiology

## 2013-11-05 VITALS — BP 132/88 | HR 76 | Ht 62.5 in | Wt 151.0 lb

## 2013-11-05 DIAGNOSIS — I252 Old myocardial infarction: Secondary | ICD-10-CM

## 2013-11-05 DIAGNOSIS — I359 Nonrheumatic aortic valve disorder, unspecified: Secondary | ICD-10-CM

## 2013-11-05 DIAGNOSIS — I2589 Other forms of chronic ischemic heart disease: Secondary | ICD-10-CM | POA: Diagnosis not present

## 2013-11-05 DIAGNOSIS — I442 Atrioventricular block, complete: Secondary | ICD-10-CM | POA: Diagnosis not present

## 2013-11-05 DIAGNOSIS — Z95810 Presence of automatic (implantable) cardiac defibrillator: Secondary | ICD-10-CM | POA: Diagnosis not present

## 2013-11-05 DIAGNOSIS — I351 Nonrheumatic aortic (valve) insufficiency: Secondary | ICD-10-CM

## 2013-11-05 DIAGNOSIS — I5022 Chronic systolic (congestive) heart failure: Secondary | ICD-10-CM

## 2013-11-05 DIAGNOSIS — I4891 Unspecified atrial fibrillation: Secondary | ICD-10-CM | POA: Diagnosis not present

## 2013-11-05 DIAGNOSIS — I255 Ischemic cardiomyopathy: Secondary | ICD-10-CM

## 2013-11-05 LAB — COMPREHENSIVE METABOLIC PANEL
ALT: 14 U/L (ref 0–35)
AST: 27 U/L (ref 0–37)
Albumin: 3.2 g/dL — ABNORMAL LOW (ref 3.5–5.2)
Alkaline Phosphatase: 117 U/L (ref 39–117)
BILIRUBIN TOTAL: 1.5 mg/dL — AB (ref 0.3–1.2)
BUN: 11 mg/dL (ref 6–23)
CHLORIDE: 102 meq/L (ref 96–112)
CO2: 30 mEq/L (ref 19–32)
Calcium: 8.9 mg/dL (ref 8.4–10.5)
Creatinine, Ser: 0.7 mg/dL (ref 0.4–1.2)
GFR: 78.94 mL/min (ref >60.00)
Glucose, Bld: 98 mg/dL (ref 70–99)
Potassium: 4.2 mEq/L (ref 3.5–5.1)
Sodium: 137 mEq/L (ref 135–145)
TOTAL PROTEIN: 7 g/dL (ref 6.0–8.3)

## 2013-11-05 LAB — CBC WITH DIFFERENTIAL/PLATELET
BASOS ABS: 0.1 10*3/uL (ref 0.0–0.1)
Basophils Relative: 0.7 % (ref 0.0–3.0)
EOS ABS: 0.2 10*3/uL (ref 0.0–0.7)
Eosinophils Relative: 2.5 % (ref 0.0–5.0)
HCT: 37.3 % (ref 36.0–46.0)
Hemoglobin: 12.4 g/dL (ref 12.0–15.0)
LYMPHS ABS: 1.8 10*3/uL (ref 0.7–4.0)
LYMPHS PCT: 25.2 % (ref 12.0–46.0)
MCHC: 33.2 g/dL (ref 30.0–36.0)
MCV: 97 fl (ref 78.0–100.0)
MONOS PCT: 11.9 % (ref 3.0–12.0)
Monocytes Absolute: 0.8 10*3/uL (ref 0.1–1.0)
Neutro Abs: 4.1 10*3/uL (ref 1.4–7.7)
Neutrophils Relative %: 59.7 % (ref 43.0–77.0)
PLATELETS: 151 10*3/uL (ref 150.0–400.0)
RBC: 3.85 Mil/uL — ABNORMAL LOW (ref 3.87–5.11)
RDW: 15.6 % — AB (ref 11.5–14.6)
WBC: 6.9 10*3/uL (ref 4.5–10.5)

## 2013-11-05 LAB — MDC_IDC_ENUM_SESS_TYPE_INCLINIC
Lead Channel Setting Pacing Amplitude: 2 V
Lead Channel Setting Pacing Pulse Width: 0.5 ms
MDC IDC PG SERIAL: 567607
MDC IDC SET LEADCHNL LV PACING PULSEWIDTH: 0.5 ms
MDC IDC SET LEADCHNL RA PACING AMPLITUDE: 2.2 V
MDC IDC SET LEADCHNL RV PACING AMPLITUDE: 2.4 V
MDC IDC SET ZONE DETECTION INTERVAL: 400 ms
Zone Setting Detection Interval: 292.6 ms
Zone Setting Detection Interval: 333.3 ms

## 2013-11-05 LAB — TSH: TSH: 0.99 u[IU]/mL (ref 0.35–5.50)

## 2013-11-05 NOTE — Progress Notes (Signed)
ICD therapies turned off per Dr. Lovena Le.  Follow up in March with [facility].

## 2013-11-05 NOTE — Progress Notes (Signed)
0     [address], [address] [place], [place]  [address] Phone: [phone] Fax:  [phone]  Date:  11/05/2013   ID:  Rhonda Smith, DOB Jan 07, 1925, MRN [id number]  PCP:  Gerrit Heck, MD   History of Present Illness: Rhonda Smith is a 80 y.o. female with prior myocardial infarction at age 28 with atrial fibrillation, prior ICD implantation Gage monitored by Dr. Lovena Le, hyperlipidemia, chronic edema here for followup. She's had one defibrillation firing at the beginning of her implantation. No trouble since. She is on low-dose amiodarone. Overall she is doing is not having any significant shortness of breath.    Wt Readings from Last 3 Encounters:  11/05/13 151 lb (68.493 kg)  07/11/13 148 lb (67.132 kg)     Past Medical History  Diagnosis Date  . Arrhythmia     afib  . Edema leg   . Alzheimer's disease   . Stasis dermatitis     Past Surgical History  Procedure Laterality Date  . Knee arthroscopy      right  . Pacemaker insertion    . Total shoulder replacement      Current Outpatient Prescriptions  Medication Sig Dispense Refill  . amiodarone (PACERONE) 100 MG tablet TAKE 1/2 TABLET ON MONDAYS, WEDNESDAYS AND FRIDAYS AS DIRECTED      . furosemide (LASIX) 20 MG tablet Take 20 mg by mouth as needed.       . metoprolol succinate (TOPROL-XL) 25 MG 24 hr tablet Take 25 mg by mouth daily.      Marland Kitchen warfarin (COUMADIN) 4 MG tablet Take 4 mg by mouth daily.       No current facility-administered medications for this visit.    Allergies:    Allergies  Allergen Reactions  . Sulfa Antibiotics     Social History:  The patient  reports that she has never smoked. She does not have any smokeless tobacco history on file.   ROS:  Please see the history of present illness.   Memory impairment, no bleeding, no syncope, no orthopnea, no PND, no chest pain    PHYSICAL EXAM: VS:  BP 132/88  Pulse 76  Ht 5' 2.5" (1.588 m)  Wt 151 lb (68.493 kg)   BMI 27.16 kg/m2  SpO2 98% Elderly, in no acute distressMemory impared. Daughter present. HEENT: normal Neck: no JVD Cardiac:  normal S1, S2; RRR; no murmurICD in place Lungs:  clear to auscultation bilaterally, no wheezing, rhonchi or rales Abd: soft, nontender, no hepatomegaly Ext: no edema Skin: warm and dry Neuro: no focal abnormalities noted  EKG:  Previously AFIB, Bi-V pacing.   Echocardiogram: 05/02/13 1. There is mild concentric left ventricle hypertrophy. 2. Left ventricular ejection fraction estimated by 2D at 50-55 percent. 3. There were no obvious regional wall motion abnormalities. 4. Moderate mitral annular calcification. 5. Trace mitral valve regurgitation. 6. Moderate calcification of the aortic valve. 7. Mild to moderate aortic valve regurgitation. 8. There is mild aortic root dilatation. 3.8cm at the sinus of Valsalva, 3.2cm distal to sinotubular junction. 9. Analysis of mitral valve inflow, pulmonary vein Doppler and tissue Doppler suggests grade II diastolic dysfunction with elevated left atrial pressure. 10. Inferior vena cava is mildly dilated but demonstrates a >50% collapse with respiration indicative of elevated central venous pressure. 11. ICD lead in right ventricle.   ASSESSMENT AND PLAN:  1. Cardiomyopathy-resolution of low ejection fraction. Last echocardiogram in July of 2014 demonstrated EF of 55%. Excellent. Previously  placed [employer] ICD, functioning properly monitored by Dr. Lovena Le. Daughter discuss with me today the possibility of disabling shock therapies on ICD. She has only had one defibrillation surrounding her myocardial infarction at age 16. She has resolution of her EF to 50-55%. I think that it is reasonable to consider disabling shock therapies given her advanced age, memory impairment. Daughter and initiated conversation. Discussed with Dr. Lovena Le. We'll have therapies turned off today in clinic. She has not been on ACE inhibitor for  several years. Resolution of EF. Reasonable to continue just Toprol. 2. Old myocardial infarction-suffered at age 59. ICD placed. Current resolution of EF. 3. AFIB - 60% atrial fibrillation on interrogation 05/20/13. Paroxysmal. I will check TSH, complete metabolic profile, CBC given her low-dose amiodarone use of 100 mg Monday WED Friday. We will continue to use amiodarone for now, daughter with some trepidation about stopping at this point. We will readdress at 6 month followup. 4. Aortic regurgitation-mild to moderate. Continue to monitor clinically. 5. 6 month follow  Signed, Candee Furbish, MD [facility]  11/05/2013 11:26 AM

## 2013-11-05 NOTE — Patient Instructions (Signed)
Your physician recommends that you continue on your current medications as directed. Please refer to the Current Medication list given to you today.  Your physician recommends that you have labs today: CBC, CMET, TSH  Your physician wants you to follow-up in: 6 months with Dr. Marlou Porch. You will receive a reminder letter in the mail two months in advance. If you don't receive a letter, please call our office to schedule the follow-up appointment.

## 2013-11-18 DIAGNOSIS — Z7901 Long term (current) use of anticoagulants: Secondary | ICD-10-CM | POA: Diagnosis not present

## 2013-11-18 DIAGNOSIS — I4891 Unspecified atrial fibrillation: Secondary | ICD-10-CM | POA: Diagnosis not present

## 2013-11-25 DIAGNOSIS — Z7901 Long term (current) use of anticoagulants: Secondary | ICD-10-CM | POA: Diagnosis not present

## 2013-11-25 DIAGNOSIS — J069 Acute upper respiratory infection, unspecified: Secondary | ICD-10-CM | POA: Diagnosis not present

## 2013-11-25 DIAGNOSIS — I4891 Unspecified atrial fibrillation: Secondary | ICD-10-CM | POA: Diagnosis not present

## 2013-11-27 DIAGNOSIS — R059 Cough, unspecified: Secondary | ICD-10-CM | POA: Diagnosis not present

## 2013-11-27 DIAGNOSIS — R05 Cough: Secondary | ICD-10-CM | POA: Diagnosis not present

## 2013-12-11 ENCOUNTER — Encounter: Payer: Self-pay | Admitting: Internal Medicine

## 2013-12-23 DIAGNOSIS — Z7901 Long term (current) use of anticoagulants: Secondary | ICD-10-CM | POA: Diagnosis not present

## 2013-12-23 DIAGNOSIS — I4891 Unspecified atrial fibrillation: Secondary | ICD-10-CM | POA: Diagnosis not present

## 2014-01-02 ENCOUNTER — Encounter: Payer: Medicare Other | Admitting: Internal Medicine

## 2014-01-03 ENCOUNTER — Encounter: Payer: Self-pay | Admitting: Internal Medicine

## 2014-03-05 DIAGNOSIS — Z7901 Long term (current) use of anticoagulants: Secondary | ICD-10-CM | POA: Diagnosis not present

## 2014-03-05 DIAGNOSIS — I4891 Unspecified atrial fibrillation: Secondary | ICD-10-CM | POA: Diagnosis not present

## 2014-05-06 ENCOUNTER — Encounter: Payer: Self-pay | Admitting: Cardiology

## 2014-05-07 ENCOUNTER — Emergency Department (HOSPITAL_COMMUNITY): Payer: Medicare Other

## 2014-05-07 ENCOUNTER — Emergency Department (HOSPITAL_COMMUNITY)
Admission: EM | Admit: 2014-05-07 | Discharge: 2014-05-07 | Disposition: A | Discharge status: Home / Self Care | Payer: Medicare Other | Attending: Emergency Medicine | Admitting: Emergency Medicine

## 2014-05-07 ENCOUNTER — Encounter (HOSPITAL_COMMUNITY): Payer: Self-pay | Admitting: Emergency Medicine

## 2014-05-07 DIAGNOSIS — W010XXA Fall on same level from slipping, tripping and stumbling without subsequent striking against object, initial encounter: Secondary | ICD-10-CM | POA: Diagnosis not present

## 2014-05-07 DIAGNOSIS — S5000XA Contusion of unspecified elbow, initial encounter: Secondary | ICD-10-CM | POA: Diagnosis not present

## 2014-05-07 DIAGNOSIS — Z7901 Long term (current) use of anticoagulants: Secondary | ICD-10-CM | POA: Insufficient documentation

## 2014-05-07 DIAGNOSIS — I4891 Unspecified atrial fibrillation: Secondary | ICD-10-CM | POA: Diagnosis not present

## 2014-05-07 DIAGNOSIS — S0990XA Unspecified injury of head, initial encounter: Secondary | ICD-10-CM | POA: Diagnosis not present

## 2014-05-07 DIAGNOSIS — T07XXXA Unspecified multiple injuries, initial encounter: Secondary | ICD-10-CM | POA: Diagnosis not present

## 2014-05-07 DIAGNOSIS — Z23 Encounter for immunization: Secondary | ICD-10-CM | POA: Diagnosis not present

## 2014-05-07 DIAGNOSIS — G309 Alzheimer's disease, unspecified: Secondary | ICD-10-CM | POA: Diagnosis not present

## 2014-05-07 DIAGNOSIS — Z9889 Other specified postprocedural states: Secondary | ICD-10-CM | POA: Insufficient documentation

## 2014-05-07 DIAGNOSIS — Z79899 Other long term (current) drug therapy: Secondary | ICD-10-CM | POA: Insufficient documentation

## 2014-05-07 DIAGNOSIS — Y9389 Activity, other specified: Secondary | ICD-10-CM | POA: Diagnosis not present

## 2014-05-07 DIAGNOSIS — I499 Cardiac arrhythmia, unspecified: Secondary | ICD-10-CM | POA: Insufficient documentation

## 2014-05-07 DIAGNOSIS — S298XXA Other specified injuries of thorax, initial encounter: Secondary | ICD-10-CM | POA: Diagnosis not present

## 2014-05-07 DIAGNOSIS — R0789 Other chest pain: Secondary | ICD-10-CM

## 2014-05-07 DIAGNOSIS — S0993XA Unspecified injury of face, initial encounter: Secondary | ICD-10-CM | POA: Diagnosis not present

## 2014-05-07 DIAGNOSIS — Y92009 Unspecified place in unspecified non-institutional (private) residence as the place of occurrence of the external cause: Secondary | ICD-10-CM | POA: Diagnosis not present

## 2014-05-07 DIAGNOSIS — IMO0002 Reserved for concepts with insufficient information to code with codable children: Secondary | ICD-10-CM | POA: Diagnosis not present

## 2014-05-07 DIAGNOSIS — R079 Chest pain, unspecified: Secondary | ICD-10-CM | POA: Diagnosis not present

## 2014-05-07 DIAGNOSIS — W19XXXA Unspecified fall, initial encounter: Secondary | ICD-10-CM

## 2014-05-07 DIAGNOSIS — F0280 Dementia in other diseases classified elsewhere without behavioral disturbance: Secondary | ICD-10-CM | POA: Diagnosis not present

## 2014-05-07 DIAGNOSIS — T148XXA Other injury of unspecified body region, initial encounter: Secondary | ICD-10-CM

## 2014-05-07 MED ORDER — HYDROCODONE-ACETAMINOPHEN 5-325 MG PO TABS
1.0000 | ORAL_TABLET | Freq: Four times a day (QID) | ORAL | Status: DC | PRN
Start: 2014-05-07 — End: 2014-05-11

## 2014-05-07 MED ORDER — TETANUS-DIPHTH-ACELL PERTUSSIS 5-2.5-18.5 LF-MCG/0.5 IM SUSP
0.5000 mL | Freq: Once | INTRAMUSCULAR | Status: AC
  Administered 2014-05-07: 0.5 mL via INTRAMUSCULAR
  Filled 2014-05-07: qty 0.5

## 2014-05-07 MED ORDER — HYDROCODONE-ACETAMINOPHEN 5-325 MG PO TABS
1.0000 | ORAL_TABLET | Freq: Once | ORAL | Status: AC
  Administered 2014-05-07: 1 via ORAL
  Filled 2014-05-07: qty 1

## 2014-05-07 NOTE — ED Provider Notes (Signed)
TIME SEEN: 4:55 PM  CHIEF COMPLAINT: Fall  HPI: Patient is an 80 year old female with history of atrial fibrillation on Coumadin, Alzheimer's dementia who presents to the emergency department after she had a mechanical fall. Patient reports that she tripped over things or lying on the floor of her house and landed on her right side. She is not sure if she hit her head. There is no loss of consciousness. She denies any chest pain, palpitations, shortness of breath, dizziness that led to her fall. Denies any numbness, tingling or focal weakness. No shortness of breath. She is complaining of anterior chest wall and right posterior rib pain, right elbow pain. She is right-hand dominant.  ROS: See HPI Constitutional: no fever  Eyes: no drainage  ENT: no runny nose   Cardiovascular:   chest pain  Resp: no SOB  GI: no vomiting GU: no dysuria Integumentary: no rash  Allergy: no hives  Musculoskeletal: no leg swelling  Neurological: no slurred speech ROS otherwise negative  PAST MEDICAL HISTORY/PAST SURGICAL HISTORY:  Past Medical History  Diagnosis Date  . Arrhythmia     afib  . Edema leg   . Alzheimer's disease   . Stasis dermatitis     MEDICATIONS:  Prior to Admission medications   Medication Sig Start Date End Date Taking? Authorizing Provider  amiodarone (PACERONE) 100 MG tablet TAKE 1/2 TABLET ON MONDAYS, [place] FRIDAYS AS DIRECTED    Historical Provider, MD  furosemide (LASIX) 20 MG tablet Take 20 mg by mouth as needed.     Historical Provider, MD  metoprolol succinate (TOPROL-XL) 25 MG 24 hr tablet Take 25 mg by mouth daily.    Historical Provider, MD  warfarin (COUMADIN) 4 MG tablet Take 4 mg by mouth daily.    Historical Provider, MD    ALLERGIES:  Allergies  Allergen Reactions  . Sulfa Antibiotics     SOCIAL HISTORY:  History  Substance Use Topics  . Smoking status: Never Smoker   . Smokeless tobacco: Not on file  . Alcohol Use:     FAMILY  HISTORY: History reviewed. No pertinent family history.  EXAM: BP 162/79  Pulse 80  Temp(Src) 97.6 F (36.4 C) (Oral)  Resp 16  SpO2 98% CONSTITUTIONAL: Alert and oriented and responds appropriately to questions. Well-appearing; well-nourished; GCS 15 HEAD: Normocephalic; atraumatic EYES: Conjunctivae clear, PERRL, EOMI ENT: normal nose; no rhinorrhea; moist mucous membranes; pharynx without lesions noted; no dental injury; no hemotypanum; no septal hematoma NECK: Supple, no meningismus, no LAD; no midline spinal tenderness, step-off or deformity CARD: RRR; S1 and S2 appreciated; no murmurs, no clicks, no rubs, no gallops RESP: Normal chest excursion without splinting or tachypnea; breath sounds clear and equal bilaterally; no wheezes, no rhonchi, no rales; chest wall stable, tender to palpation over the anterior chest wall without crepitus or ecchymosis or deformity, patient is also tender to palpation over his right posterior ribs ABD/GI: Normal bowel sounds; non-distended; soft, non-tender, no rebound, no guarding PELVIS:  stable, nontender to palpation BACK:  The back appears normal and is non-tender to palpation, there is no CVA tenderness; no midline spinal tenderness, step-off or deformity EXT: Patient has right elbow swelling and ecchymosis with decreased range of motion secondary to pain, 2+ radial pulses bilaterally, sensation to light touch intact diffusely, otherwise no tenderness or bony deformity above or below this joint on the right side, otherwise Normal ROM in all joints; otherwise extremities are non-tender to palpation; no edema; normal capillary refill; no cyanosis  SKIN: Normal color for age and race; warm, small abrasion to the lateral right elbow NEURO: Moves all extremities equally, sensation to light touch intact diffusely, cranial nerves II through XII intact PSYCH: The patient's mood and manner are appropriate. Grooming and personal hygiene are  appropriate.  MEDICAL DECISION MAKING: Patient here with mechanical fall. Will obtain CT of her head given she is on Coumadin, cervical spine given her a distracting injury, x-ray of her right elbow and chest. Will update her tetanus and give pain medication.   ED PROGRESS: Patient's imaging shows no acute abnormality. I feel she is safe to go home. We'll discharge with prescription for Vicodin as needed for pain. Have discussed with her rest, elevation and ice. Discussed head injury return precautions. Patient verbalizes understanding and is comfortable plan. We'll discharge her with her niece.     [place], DO 05/07/14 [id number]

## 2014-05-07 NOTE — ED Notes (Signed)
Bed: ZM62 Expected date:  Expected time:  Means of arrival:  Comments: EMS, Fall

## 2014-05-07 NOTE — Discharge Instructions (Signed)
Possible Head Injury You have received a head injury. It does not appear serious at this time. Headaches and vomiting are common following head injury. It should be easy to awaken from sleeping. Sometimes it is necessary for you to stay in the emergency department for a while for observation. Sometimes admission to the hospital may be needed. After injuries such as yours, most problems occur within the first 24 hours, but side effects may occur up to 7-10 days after the injury. It is important for you to carefully monitor your condition and contact your health care provider or seek immediate medical care if there is a change in your condition. WHAT ARE THE TYPES OF HEAD INJURIES? Head injuries can be as minor as a bump. Some head injuries can be more severe. More severe head injuries include:  A jarring injury to the brain (concussion).  A bruise of the brain (contusion). This mean there is bleeding in the brain that can cause swelling.  A cracked skull (skull fracture).  Bleeding in the brain that collects, clots, and forms a bump (hematoma). WHAT CAUSES A HEAD INJURY? A serious head injury is most likely to happen to someone who is in a car wreck and is not wearing a seat belt. Other causes of major head injuries include bicycle or motorcycle accidents, sports injuries, and falls. HOW ARE HEAD INJURIES DIAGNOSED? A complete history of the event leading to the injury and your current symptoms will be helpful in diagnosing head injuries. Many times, pictures of the brain, such as CT or MRI are needed to see the extent of the injury. Often, an overnight hospital stay is necessary for observation.  WHEN SHOULD I SEEK IMMEDIATE MEDICAL CARE?  You should get help right away if:  You have confusion or drowsiness.  You feel sick to your stomach (nauseous) or have continued, forceful vomiting.  You have dizziness or unsteadiness that is getting worse.  You have severe, continued headaches not  relieved by medicine. Only take over-the-counter or prescription medicines for pain, fever, or discomfort as directed by your health care provider.  You do not have normal function of the arms or legs or are unable to walk.  You notice changes in the black spots in the center of the colored part of your eye (pupil).  You have a clear or bloody fluid coming from your nose or ears.  You have a loss of vision. During the next 24 hours after the injury, you must stay with someone who can watch you for the warning signs. This person should contact local emergency services (911 in the [place] if you have seizures, you become unconscious, or you are unable to wake up. HOW CAN I PREVENT A HEAD INJURY IN THE FUTURE? The most important factor for preventing major head injuries is avoiding motor vehicle accidents. To minimize the potential for damage to your head, it is crucial to wear seat belts while riding in motor vehicles. Wearing helmets while bike riding and playing collision sports (like football) is also helpful. Also, avoiding dangerous activities around the house will further help reduce your risk of head injury.  WHEN CAN I RETURN TO NORMAL ACTIVITIES AND ATHLETICS? You should be reevaluated by your health care provider before returning to these activities. If you have any of the following symptoms, you should not return to activities or contact sports until 1 week after the symptoms have stopped:  Persistent headache.  Dizziness or vertigo.  Poor attention and concentration.  Confusion.  Memory problems.  Nausea or vomiting.  Fatigue or tire easily.  Irritability.  Intolerant of bright lights or loud noises.  Anxiety or depression.  Disturbed sleep. MAKE SURE YOU:   Understand these instructions.  Will watch your condition.  Will get help right away if you are not doing well or get worse. Document Released: 09/26/2005 Document Revised: 10/01/2013 Document Reviewed:  06/03/2013 [facility] Patient Information 2015 [place], [place]. This information is not intended to replace advice given to you by your health care provider. Make sure you discuss any questions you have with your health care provider.     Chest Wall Pain Chest wall pain is pain in or around the bones and muscles of your chest. It may take up to 6 weeks to get better. It may take longer if you must stay physically active in your work and activities.  CAUSES  Chest wall pain may happen on its own. However, it may be caused by:  A viral illness like the flu.  Injury.  Coughing.  Exercise.  Arthritis.  Fibromyalgia.  Shingles. HOME CARE INSTRUCTIONS   Avoid overtiring physical activity. Try not to strain or perform activities that cause pain. This includes any activities using your chest or your abdominal and side muscles, especially if heavy weights are used.  Put ice on the sore area.  Put ice in a plastic bag.  Place a towel between your skin and the bag.  Leave the ice on for 15-20 minutes per hour while awake for the first 2 days.  Only take over-the-counter or prescription medicines for pain, discomfort, or fever as directed by your caregiver. SEEK IMMEDIATE MEDICAL CARE IF:   Your pain increases, or you are very uncomfortable.  You have a fever.  Your chest pain becomes worse.  You have new, unexplained symptoms.  You have nausea or vomiting.  You feel sweaty or lightheaded.  You have a cough with phlegm (sputum), or you cough up blood. MAKE SURE YOU:   Understand these instructions.  Will watch your condition.  Will get help right away if you are not doing well or get worse. Document Released: 09/26/2005 Document Revised: 12/19/2011 Document Reviewed: 05/23/2011 [facility] Patient Information 2015 [place], [place]. This information is not intended to replace advice given to you by your health care provider. Make sure you discuss any questions you have with  your health care provider.    Contusion A contusion is a deep bruise. Contusions are the result of an injury that caused bleeding under the skin. The contusion may turn blue, purple, or yellow. Minor injuries will give you a painless contusion, but more severe contusions may stay painful and swollen for a few weeks.  CAUSES  A contusion is usually caused by a blow, trauma, or direct force to an area of the body. SYMPTOMS   Swelling and redness of the injured area.  Bruising of the injured area.  Tenderness and soreness of the injured area.  Pain. DIAGNOSIS  The diagnosis can be made by taking a history and physical exam. An X-ray, CT scan, or MRI may be needed to determine if there were any associated injuries, such as fractures. TREATMENT  Specific treatment will depend on what area of the body was injured. In general, the best treatment for a contusion is resting, icing, elevating, and applying cold compresses to the injured area. Over-the-counter medicines may also be recommended for pain control. Ask your caregiver what the best treatment is for your contusion. HOME CARE INSTRUCTIONS  Put ice on the injured area.  Put ice in a plastic bag.  Place a towel between your skin and the bag.  Leave the ice on for 15-20 minutes, 3-4 times a day, or as directed by your health care provider.  Only take over-the-counter or prescription medicines for pain, discomfort, or fever as directed by your caregiver. Your caregiver may recommend avoiding anti-inflammatory medicines (aspirin, ibuprofen, and naproxen) for 48 hours because these medicines may increase bruising.  Rest the injured area.  If possible, elevate the injured area to reduce swelling. SEEK IMMEDIATE MEDICAL CARE IF:   You have increased bruising or swelling.  You have pain that is getting worse.  Your swelling or pain is not relieved with medicines. MAKE SURE YOU:   Understand these instructions.  Will watch your  condition.  Will get help right away if you are not doing well or get worse. Document Released: 07/06/2005 Document Revised: 10/01/2013 Document Reviewed: 08/01/2011 [facility] Patient Information 2015 [place], [place]. This information is not intended to replace advice given to you by your health care provider. Make sure you discuss any questions you have with your health care provider.   RICE: Routine Care for Injuries The routine care of many injuries includes Rest, Ice, Compression, and Elevation (RICE). HOME CARE INSTRUCTIONS  Rest is needed to allow your body to heal. Routine activities can usually be resumed when comfortable. Injured tendons and bones can take up to 6 weeks to heal. Tendons are the cord-like structures that attach muscle to bone.  Ice following an injury helps keep the swelling down and reduces pain.  Put ice in a plastic bag.  Place a towel between your skin and the bag.  Leave the ice on for 15-20 minutes, 3-4 times a day, or as directed by your health care provider. Do this while awake, for the first 24 to 48 hours. After that, continue as directed by your caregiver.  Compression helps keep swelling down. It also gives support and helps with discomfort. If an elastic bandage has been applied, it should be removed and reapplied every 3 to 4 hours. It should not be applied tightly, but firmly enough to keep swelling down. Watch fingers or toes for swelling, bluish discoloration, coldness, numbness, or excessive pain. If any of these problems occur, remove the bandage and reapply loosely. Contact your caregiver if these problems continue.  Elevation helps reduce swelling and decreases pain. With extremities, such as the arms, hands, legs, and feet, the injured area should be placed near or above the level of the heart, if possible. SEEK IMMEDIATE MEDICAL CARE IF:  You have persistent pain and swelling.  You develop redness, numbness, or unexpected weakness.  Your  symptoms are getting worse rather than improving after several days. These symptoms may indicate that further evaluation or further X-rays are needed. Sometimes, X-rays may not show a small broken bone (fracture) until 1 week or 10 days later. Make a follow-up appointment with your caregiver. Ask when your X-ray results will be ready. Make sure you get your X-ray results. Document Released: 01/08/2001 Document Revised: 10/01/2013 Document Reviewed: 02/25/2011 [facility] Patient Information 2015 [place], [place]. This information is not intended to replace advice given to you by your health care provider. Make sure you discuss any questions you have with your health care provider.

## 2014-05-10 ENCOUNTER — Emergency Department (HOSPITAL_COMMUNITY): Payer: Medicare Other

## 2014-05-10 ENCOUNTER — Inpatient Hospital Stay (HOSPITAL_COMMUNITY)
Admission: EM | Admit: 2014-05-10 | Discharge: 2014-05-13 | DRG: 184 | Disposition: A | Discharge status: Home Health | Payer: Medicare Other | Attending: General Surgery | Admitting: General Surgery

## 2014-05-10 ENCOUNTER — Encounter (HOSPITAL_COMMUNITY): Payer: Self-pay | Admitting: Radiology

## 2014-05-10 DIAGNOSIS — S199XXA Unspecified injury of neck, initial encounter: Secondary | ICD-10-CM | POA: Diagnosis not present

## 2014-05-10 DIAGNOSIS — W010XXA Fall on same level from slipping, tripping and stumbling without subsequent striking against object, initial encounter: Secondary | ICD-10-CM | POA: Diagnosis present

## 2014-05-10 DIAGNOSIS — W19XXXA Unspecified fall, initial encounter: Secondary | ICD-10-CM

## 2014-05-10 DIAGNOSIS — R350 Frequency of micturition: Secondary | ICD-10-CM | POA: Diagnosis present

## 2014-05-10 DIAGNOSIS — Y92009 Unspecified place in unspecified non-institutional (private) residence as the place of occurrence of the external cause: Secondary | ICD-10-CM

## 2014-05-10 DIAGNOSIS — T1490XA Injury, unspecified, initial encounter: Secondary | ICD-10-CM | POA: Diagnosis not present

## 2014-05-10 DIAGNOSIS — I509 Heart failure, unspecified: Secondary | ICD-10-CM | POA: Diagnosis present

## 2014-05-10 DIAGNOSIS — F0280 Dementia in other diseases classified elsewhere without behavioral disturbance: Secondary | ICD-10-CM | POA: Diagnosis present

## 2014-05-10 DIAGNOSIS — S0990XA Unspecified injury of head, initial encounter: Secondary | ICD-10-CM | POA: Diagnosis not present

## 2014-05-10 DIAGNOSIS — Z96619 Presence of unspecified artificial shoulder joint: Secondary | ICD-10-CM

## 2014-05-10 DIAGNOSIS — Z7901 Long term (current) use of anticoagulants: Secondary | ICD-10-CM | POA: Diagnosis not present

## 2014-05-10 DIAGNOSIS — Z95810 Presence of automatic (implantable) cardiac defibrillator: Secondary | ICD-10-CM

## 2014-05-10 DIAGNOSIS — I4891 Unspecified atrial fibrillation: Secondary | ICD-10-CM | POA: Diagnosis present

## 2014-05-10 DIAGNOSIS — S2231XA Fracture of one rib, right side, initial encounter for closed fracture: Secondary | ICD-10-CM

## 2014-05-10 DIAGNOSIS — G309 Alzheimer's disease, unspecified: Secondary | ICD-10-CM | POA: Diagnosis present

## 2014-05-10 DIAGNOSIS — S2249XA Multiple fractures of ribs, unspecified side, initial encounter for closed fracture: Secondary | ICD-10-CM | POA: Diagnosis not present

## 2014-05-10 DIAGNOSIS — I5022 Chronic systolic (congestive) heart failure: Secondary | ICD-10-CM | POA: Diagnosis present

## 2014-05-10 DIAGNOSIS — R079 Chest pain, unspecified: Secondary | ICD-10-CM | POA: Diagnosis not present

## 2014-05-10 DIAGNOSIS — I442 Atrioventricular block, complete: Secondary | ICD-10-CM | POA: Diagnosis present

## 2014-05-10 DIAGNOSIS — S0993XA Unspecified injury of face, initial encounter: Secondary | ICD-10-CM | POA: Diagnosis not present

## 2014-05-10 DIAGNOSIS — S2239XA Fracture of one rib, unspecified side, initial encounter for closed fracture: Secondary | ICD-10-CM

## 2014-05-10 LAB — CBC WITH DIFFERENTIAL/PLATELET
BASOS PCT: 1 % (ref 0–1)
Basophils Absolute: 0.1 10*3/uL (ref 0.0–0.1)
Eosinophils Absolute: 0.2 10*3/uL (ref 0.0–0.7)
Eosinophils Relative: 2 % (ref 0–5)
HEMATOCRIT: 35.5 % — AB (ref 36.0–46.0)
HEMOGLOBIN: 12 g/dL (ref 12.0–15.0)
LYMPHS ABS: 1.9 10*3/uL (ref 0.7–4.0)
LYMPHS PCT: 26 % (ref 12–46)
MCH: 31.9 pg (ref 26.0–34.0)
MCHC: 33.8 g/dL (ref 30.0–36.0)
MCV: 94.4 fL (ref 78.0–100.0)
MONO ABS: 0.8 10*3/uL (ref 0.1–1.0)
MONOS PCT: 11 % (ref 3–12)
NEUTROS ABS: 4.6 10*3/uL (ref 1.7–7.7)
NEUTROS PCT: 60 % (ref 43–77)
Platelets: 180 10*3/uL (ref 150–400)
RBC: 3.76 MIL/uL — ABNORMAL LOW (ref 3.87–5.11)
RDW: 14.2 % (ref 11.5–15.5)
WBC: 7.6 10*3/uL (ref 4.0–10.5)

## 2014-05-10 LAB — BASIC METABOLIC PANEL
Anion gap: 11 (ref 5–15)
BUN: 12 mg/dL (ref 6–23)
CO2: 26 meq/L (ref 19–32)
CREATININE: 0.63 mg/dL (ref 0.50–1.10)
Calcium: 9 mg/dL (ref 8.4–10.5)
Chloride: 95 mEq/L — ABNORMAL LOW (ref 96–112)
GFR calc Af Amer: >90 mL/min (ref >90)
GFR calc non Af Amer: 78 mL/min — ABNORMAL LOW (ref >90)
Glucose, Bld: 126 mg/dL — ABNORMAL HIGH (ref 70–99)
POTASSIUM: 4.4 meq/L (ref 3.7–5.3)
Sodium: 132 mEq/L — ABNORMAL LOW (ref 137–147)

## 2014-05-10 LAB — PROTIME-INR
INR: 2.25 — AB (ref 0.00–1.49)
Prothrombin Time: 24.9 seconds — ABNORMAL HIGH (ref 11.6–15.2)

## 2014-05-10 MED ORDER — HYDROCODONE-ACETAMINOPHEN 5-325 MG PO TABS
1.0000 | ORAL_TABLET | Freq: Once | ORAL | Status: AC
  Administered 2014-05-10: 1 via ORAL
  Filled 2014-05-10: qty 1

## 2014-05-10 NOTE — ED Notes (Signed)
Pt transported to CT via stretcher with tech.  

## 2014-05-10 NOTE — ED Notes (Signed)
Pt came via EMS for fall 2 days ago. Pt c/o arm pain and rib pain.

## 2014-05-10 NOTE — ED Provider Notes (Signed)
Medical screening examination/treatment/procedure(s) were conducted as a shared visit with non-physician practitioner(s) and myself.  I personally evaluated the patient during the encounter.   EKG Interpretation None     80 yo female with a fall today.  Family states she fell to her knees.  Biggest complaint here is her right chest wall pain.  On exam, well appearing, nontoxic, not distressed, normal respiratory effort, lungs sounds equal and clear bilateral, chest wall TTP on right, no crepitus, normal perfusion.  Imaging shows 3 consecutive rib fractures.  Plan consult trauma for admission.  Clinical Impression: 1. Closed rib fracture, right, initial encounter       Houston Siren III, MD 05/11/14 [phone]

## 2014-05-10 NOTE — Progress Notes (Signed)
Pt instructed on the use of IS.  Pt performed without complication with good effort and technique.  Pt achieved 1000cc, pt instructed to do 10X's Q2 hours while awake.  RT to monitor and assess as needed.

## 2014-05-10 NOTE — ED Provider Notes (Signed)
CSN: [id number]     Arrival date & time 05/10/14  1740 History   First MD Initiated Contact with Patient 05/10/14 1800     Chief Complaint  Patient presents with  . Fall  . Rib pain      (Consider location/radiation/quality/duration/timing/severity/associated sxs/prior Treatment) HPI Comments: Patient is an 80 yo F PMHx significant for A. fib, Alzheimer's disease presenting to the emergency department for 2 complaints. First complaint is a fall that occurred today. Patient states she lost her footing, caught herself on the bed before she fell to the ground. Denies any precipitating CP, SOB, HA, lightheadedness, dizziness, LOC, syncope. Patient is on Coumadin. Patient is a level V caveat d/t dementia.   Patient is a 80 y.o. female presenting with fall.  Fall    Past Medical History  Diagnosis Date  . Arrhythmia     afib  . Edema leg   . Alzheimer's disease   . Stasis dermatitis    Past Surgical History  Procedure Laterality Date  . Knee arthroscopy      right  . Pacemaker insertion    . Total shoulder replacement     No family history on file. History  Substance Use Topics  . Smoking status: Never Smoker   . Smokeless tobacco: Not on file  . Alcohol Use:    OB History   Grav Para Term Preterm Abortions TAB SAB Ect Mult Living                 Review of Systems  Unable to perform ROS: Dementia      Allergies  Sulfa antibiotics  Home Medications   Prior to Admission medications   Medication Sig Start Date End Date Taking? Authorizing Provider  furosemide (LASIX) 20 MG tablet Take 20 mg by mouth as needed for fluid.     Historical Provider, MD  HYDROcodone-acetaminophen (NORCO/VICODIN) 5-325 MG per tablet Take 1 tablet by mouth every 6 (six) hours as needed. 05/07/14   Kristen N Ward, DO  metoprolol succinate (TOPROL-XL) 25 MG 24 hr tablet Take 25 mg by mouth daily.    Historical Provider, MD  traZODone (DESYREL) 50 MG tablet Take 50-100 mg by mouth at bedtime.     Historical Provider, MD  warfarin (COUMADIN) 4 MG tablet Take 4 mg by mouth as directed. 4 mg daily with the exception of Thursdays    Historical Provider, MD   BP 156/71  Pulse 75  Temp(Src) 98 F (36.7 C) (Oral)  Resp 20  SpO2 94% Physical Exam  Nursing note and vitals reviewed. Constitutional: She is oriented to person, place, and time. She appears well-developed and well-nourished. No distress.  HENT:  Head: Normocephalic and atraumatic.  Right Ear: External ear normal.  Left Ear: External ear normal.  Nose: Nose normal.  Mouth/Throat: Oropharynx is clear and moist. No oropharyngeal exudate.  Eyes: Conjunctivae and EOM are normal. Pupils are equal, round, and reactive to light.  Neck: Normal range of motion. Neck supple.  Cardiovascular: Normal rate, regular rhythm, normal heart sounds and intact distal pulses.   Pulmonary/Chest: Effort normal and breath sounds normal. No respiratory distress. She exhibits tenderness. She exhibits no mass, no edema, no deformity and no retraction.    No bruising noted to the chest wall.  Abdominal: Soft. There is no tenderness.  Neurological: She is alert and oriented to person, place, and time. She has normal strength. No cranial nerve deficit. Gait normal. GCS eye subscore is 4. GCS verbal subscore is  5. GCS motor subscore is 6.  Sensation grossly intact.  No pronator drift.  Bilateral heel-knee-shin intact.  Skin: Skin is warm and dry. She is not diaphoretic.  Bruising noted to right arm, stable per family.    ED Course  Procedures (including critical care time) Medications  metoprolol succinate (TOPROL-XL) 24 hr tablet 25 mg (not administered)  0.9 % NaCl with KCl 20 mEq/ L  infusion (not administered)  HYDROcodone-acetaminophen (NORCO/VICODIN) 5-325 MG per tablet 1 tablet (not administered)  HYDROcodone-acetaminophen (NORCO/VICODIN) 5-325 MG per tablet 2 tablet (not administered)  morphine 2 MG/ML injection 1 mg (not administered)    morphine 2 MG/ML injection 2 mg (not administered)  ondansetron (ZOFRAN) tablet 4 mg (not administered)    Or  ondansetron (ZOFRAN) injection 4 mg (not administered)  docusate sodium (COLACE) capsule 100 mg (not administered)  HYDROcodone-acetaminophen (NORCO/VICODIN) 5-325 MG per tablet 1 tablet (1 tablet Oral Given 05/10/14 1854)    Labs Review Labs Reviewed  BASIC METABOLIC PANEL - Abnormal; Notable for the following:    Sodium 132 (*)    Chloride 95 (*)    Glucose, Bld 126 (*)    GFR calc non Af Amer 78 (*)    All other components within normal limits  CBC WITH DIFFERENTIAL - Abnormal; Notable for the following:    RBC 3.76 (*)    HCT 35.5 (*)    All other components within normal limits  PROTIME-INR - Abnormal; Notable for the following:    Prothrombin Time 24.9 (*)    INR 2.25 (*)    All other components within normal limits    Imaging Review Dg Ribs Unilateral W/chest Right  05/10/2014   CLINICAL DATA:  Right lower, anterior rib pain following a fall 2 days ago.  EXAM: RIGHT RIBS AND CHEST - 3+ VIEW  COMPARISON:  Chest dated 05/07/2014.  FINDINGS: Stable enlarged cardiac silhouette and left subclavian pacer and AICD leads. Stable left shoulder prosthesis and right shoulder degenerative changes with superior migration of the humeral head, compatible with a large, chronic rotator cuff tear. The lungs remain clear with stable mild prominence Add of the interstitial markings. Mildly displaced right fifth and sixth anterolateral rib fractures. There is also an essentially nondisplaced right seventh anterolateral rib fracture. No pneumothorax. Lumbar and thoracic spine degenerative changes. Lumbar and thoracic spine degenerative changes.  IMPRESSION: 1. Right fifth through seventh rib fractures without pneumothorax. 2. Stable cardiomegaly and mild changes of COPD.   Electronically Signed   By: Enrique Sack M.D.   On: 05/10/2014 19:12   Ct Head Wo Contrast  05/10/2014   CLINICAL DATA:   Arm and rib pain following a fall 2 days ago.  EXAM: CT HEAD WITHOUT CONTRAST  CT CERVICAL SPINE WITHOUT CONTRAST  TECHNIQUE: Multidetector CT imaging of the head and cervical spine was performed following the standard protocol without intravenous contrast. Multiplanar CT image reconstructions of the cervical spine were also generated.  COMPARISON:  05/07/2014  FINDINGS: CT HEAD FINDINGS  Diffusely enlarged ventricles and subarachnoid spaces. Patchy white matter low density in both cerebral hemispheres. Stable old left basal ganglia lacunar infarct. No skull fracture, intracranial hemorrhage or paranasal sinus air-fluid levels.  CT CERVICAL SPINE FINDINGS  Multilevel degenerative changes and mild thoracolumbar scoliosis. Stable reversal of the normal cervical lordosis and mild anterior subluxation at the C4-5 level. No prevertebral soft tissue swelling, fractures or acute subluxations. Bilateral carotid artery calcifications. Bilateral thyroid masses are again demonstrated without significant change. These include a  2.4 x 1.6 cm left lobe mass on image number 61 and a 0.8 x 0.7 cm right lobe mass on image number 58.  IMPRESSION: 1. No acute abnormality. 2. Stable diffuse cerebral and cerebellar atrophy, chronic small vessel white matter ischemic changes and old left basal ganglia lacunar infarct. 3. Stable multilevel cervical spine degenerative changes and reversal of the normal cervical lordosis. 4. Bilateral carotid artery atheromatous calcifications. 5. Stable bilateral thyroid nodules including a 2.4 cm dominant nodule on the left. Again, recommend further evaluation with elective thyroid ultrasound. If patient is clinically hyperthyroid, consider nuclear medicine thyroid uptake and scan.   Electronically Signed   By: Enrique Sack M.D.   On: 05/10/2014 19:06   Ct Cervical Spine Wo Contrast  05/10/2014   CLINICAL DATA:  Arm and rib pain following a fall 2 days ago.  EXAM: CT HEAD WITHOUT CONTRAST  CT CERVICAL  SPINE WITHOUT CONTRAST  TECHNIQUE: Multidetector CT imaging of the head and cervical spine was performed following the standard protocol without intravenous contrast. Multiplanar CT image reconstructions of the cervical spine were also generated.  COMPARISON:  05/07/2014  FINDINGS: CT HEAD FINDINGS  Diffusely enlarged ventricles and subarachnoid spaces. Patchy white matter low density in both cerebral hemispheres. Stable old left basal ganglia lacunar infarct. No skull fracture, intracranial hemorrhage or paranasal sinus air-fluid levels.  CT CERVICAL SPINE FINDINGS  Multilevel degenerative changes and mild thoracolumbar scoliosis. Stable reversal of the normal cervical lordosis and mild anterior subluxation at the C4-5 level. No prevertebral soft tissue swelling, fractures or acute subluxations. Bilateral carotid artery calcifications. Bilateral thyroid masses are again demonstrated without significant change. These include a 2.4 x 1.6 cm left lobe mass on image number 61 and a 0.8 x 0.7 cm right lobe mass on image number 58.  IMPRESSION: 1. No acute abnormality. 2. Stable diffuse cerebral and cerebellar atrophy, chronic small vessel white matter ischemic changes and old left basal ganglia lacunar infarct. 3. Stable multilevel cervical spine degenerative changes and reversal of the normal cervical lordosis. 4. Bilateral carotid artery atheromatous calcifications. 5. Stable bilateral thyroid nodules including a 2.4 cm dominant nodule on the left. Again, recommend further evaluation with elective thyroid ultrasound. If patient is clinically hyperthyroid, consider nuclear medicine thyroid uptake and scan.   Electronically Signed   By: Enrique Sack M.D.   On: 05/10/2014 19:06     EKG Interpretation None      MDM   Final diagnoses:  Closed rib fracture, right, initial encounter    Filed Vitals:   05/10/14 2244  BP: 156/71  Pulse: 75  Temp: 98 F (36.7 C)  Resp: 20   Afebrile, NAD, non-toxic  appearing. I have reviewed nursing notes, vital signs, and all appropriate lab and imaging results for this patient. No neurofocal deficits on examination. No respiratory distress or accessory muscle use. Patient's not tachypneic. Breath sounds are clear bilaterally. Chest wall tenderness noted, without bruising. CT head and cervical spine negative. CXR remarkable for three rib fractures. Trauma service consulted, recommend transfer to [facility] for admission. Pain and symptoms managed in ED. Patient d/w with Dr. Doy Mince, agrees with plan.    Harlow Mares, PA-C 05/11/14 [phone]

## 2014-05-10 NOTE — H&P (Signed)
Patient ID: Rhonda Smith, female   DOB: 05-30-1925, 80 y.o.   MRN: [id number] Chief Complaint:  5-7 rib fractures on the right after fall 2 days ago  History of Present Illness:  Rhonda Smith is an 80 y.o. female who was seen in the [facility] on 7/29 after a fall and who was sent home to return today for further workup.  CT today shows right sided rib fractures in an elderly lady on anticoagulants who fell.    Past Medical History  Diagnosis Date  . Arrhythmia     afib  . Edema leg   . Alzheimer's disease   . Stasis dermatitis     Past Surgical History  Procedure Laterality Date  . Knee arthroscopy      right  . Pacemaker insertion    . Total shoulder replacement      No current facility-administered medications for this encounter.   Current Outpatient Prescriptions  Medication Sig Dispense Refill  . furosemide (LASIX) 20 MG tablet Take 20 mg by mouth as needed for fluid.       Marland Kitchen HYDROcodone-acetaminophen (NORCO/VICODIN) 5-325 MG per tablet Take 1 tablet by mouth every 6 (six) hours as needed.  15 tablet  0  . metoprolol succinate (TOPROL-XL) 25 MG 24 hr tablet Take 25 mg by mouth daily.      . traZODone (DESYREL) 50 MG tablet Take 50-100 mg by mouth at bedtime.      Marland Kitchen warfarin (COUMADIN) 4 MG tablet Take 4 mg by mouth as directed. 4 mg daily with the exception of Thursdays       Sulfa antibiotics No family history on file. Social History:   reports that she has never smoked. She does not have any smokeless tobacco history on file. Her alcohol and drug histories are not on file.   REVIEW OF SYSTEMS : Negative except for dementia and implantable defibrillator  Physical Exam:   Blood pressure 138/66, pulse 81, temperature 98.5 F (36.9 C), temperature source Oral, resp. rate 18, SpO2 93.00%. There is no weight on file to calculate BMI.  Gen:  WDWN WF NAD  Neurological: Pleasant and not complaining.  Oriented to person and place and time.  Motor and sensory function is grossly  intact  Head: Normocephalic and atraumatic.  Eyes: Conjunctivae are normal. Pupils are equal, round, and reactive to light. No scleral icterus.  Neck: Normal range of motion. Neck supple. No tracheal deviation but CT shows thyroid nodules Cardiovascular:  SR without murmurs or gallops.  No carotid bruits Breast:  Not examined Respiratory: Effort normal.  No respiratory distress. No chest wall tenderness. Breath sounds normal.  No wheezes, rales or rhonchi.  Abdomen:  nontender GU:  Not examined Musculoskeletal: Normal range of motion. Extremities the right arm is swollen and bruised  LABORATORY RESULTS: No results found for this or any previous visit (from the past 48 hour(s)).   RADIOLOGY RESULTS: Dg Ribs Unilateral W/chest Right  05/10/2014   CLINICAL DATA:  Right lower, anterior rib pain following a fall 2 days ago.  EXAM: RIGHT RIBS AND CHEST - 3+ VIEW  COMPARISON:  Chest dated 05/07/2014.  FINDINGS: Stable enlarged cardiac silhouette and left subclavian pacer and AICD leads. Stable left shoulder prosthesis and right shoulder degenerative changes with superior migration of the humeral head, compatible with a large, chronic rotator cuff tear. The lungs remain clear with stable mild prominence Add of the interstitial markings. Mildly displaced right fifth and sixth anterolateral rib fractures. There  is also an essentially nondisplaced right seventh anterolateral rib fracture. No pneumothorax. Lumbar and thoracic spine degenerative changes. Lumbar and thoracic spine degenerative changes.  IMPRESSION: 1. Right fifth through seventh rib fractures without pneumothorax. 2. Stable cardiomegaly and mild changes of COPD.   Electronically Signed   By: Enrique Sack M.D.   On: 05/10/2014 19:12   Ct Head Wo Contrast  05/10/2014   CLINICAL DATA:  Arm and rib pain following a fall 2 days ago.  EXAM: CT HEAD WITHOUT CONTRAST  CT CERVICAL SPINE WITHOUT CONTRAST  TECHNIQUE: Multidetector CT imaging of the head and  cervical spine was performed following the standard protocol without intravenous contrast. Multiplanar CT image reconstructions of the cervical spine were also generated.  COMPARISON:  05/07/2014  FINDINGS: CT HEAD FINDINGS  Diffusely enlarged ventricles and subarachnoid spaces. Patchy white matter low density in both cerebral hemispheres. Stable old left basal ganglia lacunar infarct. No skull fracture, intracranial hemorrhage or paranasal sinus air-fluid levels.  CT CERVICAL SPINE FINDINGS  Multilevel degenerative changes and mild thoracolumbar scoliosis. Stable reversal of the normal cervical lordosis and mild anterior subluxation at the C4-5 level. No prevertebral soft tissue swelling, fractures or acute subluxations. Bilateral carotid artery calcifications. Bilateral thyroid masses are again demonstrated without significant change. These include a 2.4 x 1.6 cm left lobe mass on image number 61 and a 0.8 x 0.7 cm right lobe mass on image number 58.  IMPRESSION: 1. No acute abnormality. 2. Stable diffuse cerebral and cerebellar atrophy, chronic small vessel white matter ischemic changes and old left basal ganglia lacunar infarct. 3. Stable multilevel cervical spine degenerative changes and reversal of the normal cervical lordosis. 4. Bilateral carotid artery atheromatous calcifications. 5. Stable bilateral thyroid nodules including a 2.4 cm dominant nodule on the left. Again, recommend further evaluation with elective thyroid ultrasound. If patient is clinically hyperthyroid, consider nuclear medicine thyroid uptake and scan.   Electronically Signed   By: Enrique Sack M.D.   On: 05/10/2014 19:06   Ct Cervical Spine Wo Contrast  05/10/2014   CLINICAL DATA:  Arm and rib pain following a fall 2 days ago.  EXAM: CT HEAD WITHOUT CONTRAST  CT CERVICAL SPINE WITHOUT CONTRAST  TECHNIQUE: Multidetector CT imaging of the head and cervical spine was performed following the standard protocol without intravenous contrast.  Multiplanar CT image reconstructions of the cervical spine were also generated.  COMPARISON:  05/07/2014  FINDINGS: CT HEAD FINDINGS  Diffusely enlarged ventricles and subarachnoid spaces. Patchy white matter low density in both cerebral hemispheres. Stable old left basal ganglia lacunar infarct. No skull fracture, intracranial hemorrhage or paranasal sinus air-fluid levels.  CT CERVICAL SPINE FINDINGS  Multilevel degenerative changes and mild thoracolumbar scoliosis. Stable reversal of the normal cervical lordosis and mild anterior subluxation at the C4-5 level. No prevertebral soft tissue swelling, fractures or acute subluxations. Bilateral carotid artery calcifications. Bilateral thyroid masses are again demonstrated without significant change. These include a 2.4 x 1.6 cm left lobe mass on image number 61 and a 0.8 x 0.7 cm right lobe mass on image number 58.  IMPRESSION: 1. No acute abnormality. 2. Stable diffuse cerebral and cerebellar atrophy, chronic small vessel white matter ischemic changes and old left basal ganglia lacunar infarct. 3. Stable multilevel cervical spine degenerative changes and reversal of the normal cervical lordosis. 4. Bilateral carotid artery atheromatous calcifications. 5. Stable bilateral thyroid nodules including a 2.4 cm dominant nodule on the left. Again, recommend further evaluation with elective thyroid ultrasound. If patient  is clinically hyperthyroid, consider nuclear medicine thyroid uptake and scan.   Electronically Signed   By: Enrique Sack M.D.   On: 05/10/2014 19:06    Problem List: Patient Active Problem List   Diagnosis Date Noted  . Atrial fibrillation 07/11/2013  . Chronic systolic heart failure 50/56/9794  . Complete heart block 07/11/2013  . ICD (implantable cardioverter-defibrillator), biventricular, in situ 07/11/2013  . Dementia 07/11/2013    Assessment & Plan: Seen for [facility] to triage to [facility] ED for evaluation for placement on trauma service.   Injuries are at least 18 days old and occurred where she lives at [facility].   Transfer to [facility] ED    Matt B. Hassell Done, MD, [facility] [phone] beeper [phone]  05/10/2014 10:19 PM

## 2014-05-10 NOTE — ED Notes (Addendum)
Called pt's family Roanna Banning [phone]) advising them of the imminent transfer to [place] room 15.

## 2014-05-10 NOTE — ED Notes (Signed)
Bed: WA21 Expected date:  Expected time:  Means of arrival:  Comments: EMS fall rib pain

## 2014-05-11 ENCOUNTER — Encounter (HOSPITAL_COMMUNITY): Payer: Self-pay | Admitting: *Deleted

## 2014-05-11 LAB — MRSA PCR SCREENING: MRSA by PCR: NEGATIVE

## 2014-05-11 MED ORDER — POTASSIUM CHLORIDE IN NACL 20-0.9 MEQ/L-% IV SOLN
INTRAVENOUS | Status: DC
Start: 2014-05-11 — End: 2014-05-12
  Administered 2014-05-11: 1000 mL via INTRAVENOUS
  Administered 2014-05-11: 21:00:00 via INTRAVENOUS
  Filled 2014-05-11 (×4): qty 1000

## 2014-05-11 MED ORDER — TRAZODONE HCL 50 MG PO TABS
50.0000 mg | ORAL_TABLET | Freq: Every day | ORAL | Status: DC
  Administered 2014-05-11 – 2014-05-12 (×2): 50 mg via ORAL
  Filled 2014-05-11 (×3): qty 1

## 2014-05-11 MED ORDER — ONDANSETRON HCL 4 MG/2ML IJ SOLN: 4.0000 mg | Freq: Four times a day (QID) | INTRAMUSCULAR | Status: DC | PRN

## 2014-05-11 MED ORDER — HYDROCODONE-ACETAMINOPHEN 5-325 MG PO TABS
1.0000 | ORAL_TABLET | ORAL | Status: DC | PRN
  Administered 2014-05-11: 1 via ORAL
  Filled 2014-05-11: qty 1

## 2014-05-11 MED ORDER — WARFARIN SODIUM 4 MG PO TABS
4.0000 mg | ORAL_TABLET | Freq: Every day | ORAL | Status: DC
  Filled 2014-05-11: qty 1

## 2014-05-11 MED ORDER — ONDANSETRON HCL 4 MG PO TABS: 4.0000 mg | ORAL_TABLET | Freq: Four times a day (QID) | ORAL | Status: DC | PRN

## 2014-05-11 MED ORDER — WARFARIN - PHYSICIAN DOSING INPATIENT
Freq: Every day | Status: DC
  Administered 2014-05-12: 1

## 2014-05-11 MED ORDER — ACETAMINOPHEN 325 MG PO TABS
650.0000 mg | ORAL_TABLET | Freq: Four times a day (QID) | ORAL | Status: DC | PRN
  Administered 2014-05-11 – 2014-05-12 (×2): 650 mg via ORAL
  Filled 2014-05-11 (×2): qty 2

## 2014-05-11 MED ORDER — METOPROLOL SUCCINATE ER 25 MG PO TB24
25.0000 mg | ORAL_TABLET | Freq: Every day | ORAL | Status: DC
  Administered 2014-05-11 – 2014-05-13 (×3): 25 mg via ORAL
  Filled 2014-05-11 (×3): qty 1

## 2014-05-11 MED ORDER — MORPHINE SULFATE 2 MG/ML IJ SOLN
1.0000 mg | INTRAMUSCULAR | Status: DC | PRN
  Administered 2014-05-11: 1 mg via INTRAVENOUS
  Filled 2014-05-11: qty 1

## 2014-05-11 MED ORDER — DOCUSATE SODIUM 100 MG PO CAPS
100.0000 mg | ORAL_CAPSULE | Freq: Two times a day (BID) | ORAL | Status: DC
  Administered 2014-05-11 (×2): 100 mg via ORAL
  Filled 2014-05-11 (×4): qty 1

## 2014-05-11 MED ORDER — WARFARIN SODIUM 4 MG PO TABS
4.0000 mg | ORAL_TABLET | ORAL | Status: DC
  Administered 2014-05-11 – 2014-05-12 (×2): 4 mg via ORAL
  Filled 2014-05-11 (×4): qty 1

## 2014-05-11 MED ORDER — HYDROCODONE-ACETAMINOPHEN 5-325 MG PO TABS: 2.0000 | ORAL_TABLET | ORAL | Status: DC | PRN

## 2014-05-11 MED ORDER — MORPHINE SULFATE 2 MG/ML IJ SOLN: 2.0000 mg | INTRAMUSCULAR | Status: DC | PRN

## 2014-05-11 NOTE — Progress Notes (Signed)
Report called to gabriel rn, pt transferring to 5N10 via bed with belongigns. Niece at bedside.

## 2014-05-11 NOTE — Progress Notes (Signed)
PHARMACIST - PHYSICIAN COMMUNICATION DR:  Grandville Silos  CONCERNING: Pharmacy Care Issues Regarding Warfarin Labs  RECOMMENDATION (Action Taken): A baseline and daily protime for three days has been ordered to meet the [facility] Patient safety goal and comply with the current [place].   The Pharmacy will defer all warfarin dose order changes and follow up of lab results to the prescriber unless an additional order to initiate a "pharmacy Coumadin consult" is placed.  DESCRIPTION:  While hospitalized, to be in compliance with [facility] Patient Safety Goals, all patients on warfarin must have a baseline and/or current protime prior to the administration of warfarin. Pharmacy has received your order for warfarin without these required laboratory assessments.  Thank you-  Rozetta Stumpp M. Lesli Issa, Pharm.D Clinical Pharmacy Resident Pager: [phone] 05/11/2014 .10:53 AM

## 2014-05-11 NOTE — ED Provider Notes (Signed)
Medical screening examination/treatment/procedure(s) were conducted as a shared visit with non-physician practitioner(s) and myself.  I personally evaluated the patient during the encounter.   EKG Interpretation None         Houston Siren III, MD 05/11/14 2150

## 2014-05-11 NOTE — Progress Notes (Signed)
Patient ID: TELIYAH ROYAL, female   DOB: 1925/06/05, 80 y.o.   MRN: [id number] Patient ID: SALIHA SALTS, female DOB: 1925-03-07, 80 y.o. MRN: [id number]  Chief Complaint: 5-7 rib fractures on the right after fall 2 days ago  History of Present Illness: CHELISE HANGER is an 80 y.o. female who was seen in the [facility] on 7/29 after a fall and who was sent home to return today for further workup. CT today shows right sided rib fractures in an elderly lady on anticoagulants who fell.  Past Medical History   Diagnosis  Date   .  Arrhythmia      afib   .  Edema leg    .  Alzheimer's disease    .  Stasis dermatitis     Past Surgical History   Procedure  Laterality  Date   .  Knee arthroscopy       right   .  Pacemaker insertion     .  Total shoulder replacement      No current facility-administered medications for this encounter.    Current Outpatient Prescriptions   Medication  Sig  Dispense  Refill   .  furosemide (LASIX) 20 MG tablet  Take 20 mg by mouth as needed for fluid.     Marland Kitchen  HYDROcodone-acetaminophen (NORCO/VICODIN) 5-325 MG per tablet  Take 1 tablet by mouth every 6 (six) hours as needed.  15 tablet  0   .  metoprolol succinate (TOPROL-XL) 25 MG 24 hr tablet  Take 25 mg by mouth daily.     .  traZODone (DESYREL) 50 MG tablet  Take 50-100 mg by mouth at bedtime.     Marland Kitchen  warfarin (COUMADIN) 4 MG tablet  Take 4 mg by mouth as directed. 4 mg daily with the exception of Thursdays     Sulfa antibiotics  No family history on file.  Social History: reports that she has never smoked. She does not have any smokeless tobacco history on file. Her alcohol and drug histories are not on file.  REVIEW OF SYSTEMS :  Negative except for dementia and implantable defibrillator  Physical Exam:  Blood pressure 138/66, pulse 81, temperature 98.5 F (36.9 C), temperature source Oral, resp. rate 18, SpO2 93.00%.  There is no weight on file to calculate BMI.  Gen: WDWN WF NAD  Neurological: Pleasant and not  complaining. Oriented to person and place and time. Motor and sensory function is grossly intact  Head: Normocephalic and atraumatic.  Eyes: Conjunctivae are normal. Pupils are equal, round, and reactive to light. No scleral icterus.  Neck: Normal range of motion. Neck supple. No tracheal deviation but CT shows thyroid nodules  Cardiovascular: SR without murmurs or gallops. No carotid bruits  Breast: Not examined  Respiratory: Effort normal. No respiratory distress. No chest wall tenderness. Breath sounds normal. No wheezes, rales or rhonchi.  Abdomen: nontender  GU: Not examined  Musculoskeletal: Normal range of motion. Extremities the right arm is swollen and bruised  LABORATORY RESULTS:  No results found for this or any previous visit (from the past 48 hour(s)).  RADIOLOGY RESULTS:  Dg Ribs Unilateral W/chest Right  05/10/2014 CLINICAL DATA: Right lower, anterior rib pain following a fall 2 days ago. EXAM: RIGHT RIBS AND CHEST - 3+ VIEW COMPARISON: Chest dated 05/07/2014. FINDINGS: Stable enlarged cardiac silhouette and left subclavian pacer and AICD leads. Stable left shoulder prosthesis and right shoulder degenerative changes with superior migration of the humeral head, compatible with  a large, chronic rotator cuff tear. The lungs remain clear with stable mild prominence Add of the interstitial markings. Mildly displaced right fifth and sixth anterolateral rib fractures. There is also an essentially nondisplaced right seventh anterolateral rib fracture. No pneumothorax. Lumbar and thoracic spine degenerative changes. Lumbar and thoracic spine degenerative changes. IMPRESSION: 1. Right fifth through seventh rib fractures without pneumothorax. 2. Stable cardiomegaly and mild changes of COPD. Electronically Signed By: Enrique Sack M.D. On: 05/10/2014 19:12  Ct Head Wo Contrast  05/10/2014 CLINICAL DATA: Arm and rib pain following a fall 2 days ago. EXAM: CT HEAD WITHOUT CONTRAST CT CERVICAL SPINE  WITHOUT CONTRAST TECHNIQUE: Multidetector CT imaging of the head and cervical spine was performed following the standard protocol without intravenous contrast. Multiplanar CT image reconstructions of the cervical spine were also generated. COMPARISON: 05/07/2014 FINDINGS: CT HEAD FINDINGS Diffusely enlarged ventricles and subarachnoid spaces. Patchy white matter low density in both cerebral hemispheres. Stable old left basal ganglia lacunar infarct. No skull fracture, intracranial hemorrhage or paranasal sinus air-fluid levels. CT CERVICAL SPINE FINDINGS Multilevel degenerative changes and mild thoracolumbar scoliosis. Stable reversal of the normal cervical lordosis and mild anterior subluxation at the C4-5 level. No prevertebral soft tissue swelling, fractures or acute subluxations. Bilateral carotid artery calcifications. Bilateral thyroid masses are again demonstrated without significant change. These include a 2.4 x 1.6 cm left lobe mass on image number 61 and a 0.8 x 0.7 cm right lobe mass on image number 58. IMPRESSION: 1. No acute abnormality. 2. Stable diffuse cerebral and cerebellar atrophy, chronic small vessel white matter ischemic changes and old left basal ganglia lacunar infarct. 3. Stable multilevel cervical spine degenerative changes and reversal of the normal cervical lordosis. 4. Bilateral carotid artery atheromatous calcifications. 5. Stable bilateral thyroid nodules including a 2.4 cm dominant nodule on the left. Again, recommend further evaluation with elective thyroid ultrasound. If patient is clinically hyperthyroid, consider nuclear medicine thyroid uptake and scan. Electronically Signed By: Enrique Sack M.D. On: 05/10/2014 19:06  Ct Cervical Spine Wo Contrast  05/10/2014 CLINICAL DATA: Arm and rib pain following a fall 2 days ago. EXAM: CT HEAD WITHOUT CONTRAST CT CERVICAL SPINE WITHOUT CONTRAST TECHNIQUE: Multidetector CT imaging of the head and cervical spine was performed following the  standard protocol without intravenous contrast. Multiplanar CT image reconstructions of the cervical spine were also generated. COMPARISON: 05/07/2014 FINDINGS: CT HEAD FINDINGS Diffusely enlarged ventricles and subarachnoid spaces. Patchy white matter low density in both cerebral hemispheres. Stable old left basal ganglia lacunar infarct. No skull fracture, intracranial hemorrhage or paranasal sinus air-fluid levels. CT CERVICAL SPINE FINDINGS Multilevel degenerative changes and mild thoracolumbar scoliosis. Stable reversal of the normal cervical lordosis and mild anterior subluxation at the C4-5 level. No prevertebral soft tissue swelling, fractures or acute subluxations. Bilateral carotid artery calcifications. Bilateral thyroid masses are again demonstrated without significant change. These include a 2.4 x 1.6 cm left lobe mass on image number 61 and a 0.8 x 0.7 cm right lobe mass on image number 58. IMPRESSION: 1. No acute abnormality. 2. Stable diffuse cerebral and cerebellar atrophy, chronic small vessel white matter ischemic changes and old left basal ganglia lacunar infarct. 3. Stable multilevel cervical spine degenerative changes and reversal of the normal cervical lordosis. 4. Bilateral carotid artery atheromatous calcifications. 5. Stable bilateral thyroid nodules including a 2.4 cm dominant nodule on the left. Again, recommend further evaluation with elective thyroid ultrasound. If patient is clinically hyperthyroid, consider nuclear medicine thyroid uptake and scan. Electronically Signed By: Richardson Landry  Joneen Caraway M.D. On: 05/10/2014 19:06  Problem List:  Patient Active Problem List    Diagnosis  Date Noted   .  Atrial fibrillation  07/11/2013   .  Chronic systolic heart failure  63/78/5885   .  Complete heart block  07/11/2013   .  ICD (implantable cardioverter-defibrillator), biventricular, in situ  07/11/2013   .  Dementia  07/11/2013   Assessment & Plan:  Seen for [facility] to triage to [facility] ED for  evaluation for placement on trauma service. Injuries are at least 8 days old and occurred where she lives at [facility] [employer].  Transfer to [facility] ED  Matt B. Hassell Done, MD, [facility]  [phone] beeper  [phone]

## 2014-05-11 NOTE — Progress Notes (Signed)
Patient ID: Rhonda Smith, female   DOB: 1925/08/01, 80 y.o.   MRN: [id number]    Subjective: C/O right rib pain  Objective: Vital signs in last 24 hours: Temp:  [97.7 F (36.5 C)-98.5 F (36.9 C)] 98.3 F (36.8 C) (08/02 0754) Pulse Rate:  [75-85] 85 (08/02 0754) Resp:  [14-25] 25 (08/02 0754) BP: (138-179)/(66-77) 155/70 mmHg (08/02 0754) SpO2:  [92 %-94 %] 92 % (08/02 0754) Weight:  [147 lb 11.3 oz (67 kg)] 147 lb 11.3 oz (67 kg) (08/02 0015) Last BM Date: 05/10/14  Intake/Output from previous day: 08/01 0701 - 08/02 0700 In: 250 [I.V.:250] Out: 450 [Urine:450] Intake/Output this shift: Total I/O In: 340 [P.O.:240; I.V.:100] Out: 150 [Urine:150]  General appearance: alert and cooperative Resp: clear to auscultation bilaterally Chest wall: right sided chest wall tenderness Cardio: regular rate and rhythm GI: soft, NT, ND Neurologic: Mental status: Alert, oriented, thought content appropriate  Lab Results: CBC   Recent Labs  05/10/14 2239  WBC 7.6  HGB 12.0  HCT 35.5*  PLT 180   BMET  Recent Labs  05/10/14 2239  NA 132*  K 4.4  CL 95*  CO2 26  GLUCOSE 126*  BUN 12  CREATININE 0.63  CALCIUM 9.0   PT/INR  Recent Labs  05/10/14 2239  LABPROT 24.9*  INR 2.25*   ABG No results found for this basename: PHART, PCO2, PO2, HCO3,  in the last 72 hours  Studies/Results: Dg Ribs Unilateral W/chest Right  05/10/2014   CLINICAL DATA:  Right lower, anterior rib pain following a fall 2 days ago.  EXAM: RIGHT RIBS AND CHEST - 3+ VIEW  COMPARISON:  Chest dated 05/07/2014.  FINDINGS: Stable enlarged cardiac silhouette and left subclavian pacer and AICD leads. Stable left shoulder prosthesis and right shoulder degenerative changes with superior migration of the humeral head, compatible with a large, chronic rotator cuff tear. The lungs remain clear with stable mild prominence Add of the interstitial markings. Mildly displaced right fifth and sixth anterolateral rib  fractures. There is also an essentially nondisplaced right seventh anterolateral rib fracture. No pneumothorax. Lumbar and thoracic spine degenerative changes. Lumbar and thoracic spine degenerative changes.  IMPRESSION: 1. Right fifth through seventh rib fractures without pneumothorax. 2. Stable cardiomegaly and mild changes of COPD.   Electronically Signed   By: Enrique Sack M.D.   On: 05/10/2014 19:12   Ct Head Wo Contrast  05/10/2014   CLINICAL DATA:  Arm and rib pain following a fall 2 days ago.  EXAM: CT HEAD WITHOUT CONTRAST  CT CERVICAL SPINE WITHOUT CONTRAST  TECHNIQUE: Multidetector CT imaging of the head and cervical spine was performed following the standard protocol without intravenous contrast. Multiplanar CT image reconstructions of the cervical spine were also generated.  COMPARISON:  05/07/2014  FINDINGS: CT HEAD FINDINGS  Diffusely enlarged ventricles and subarachnoid spaces. Patchy white matter low density in both cerebral hemispheres. Stable old left basal ganglia lacunar infarct. No skull fracture, intracranial hemorrhage or paranasal sinus air-fluid levels.  CT CERVICAL SPINE FINDINGS  Multilevel degenerative changes and mild thoracolumbar scoliosis. Stable reversal of the normal cervical lordosis and mild anterior subluxation at the C4-5 level. No prevertebral soft tissue swelling, fractures or acute subluxations. Bilateral carotid artery calcifications. Bilateral thyroid masses are again demonstrated without significant change. These include a 2.4 x 1.6 cm left lobe mass on image number 61 and a 0.8 x 0.7 cm right lobe mass on image number 58.  IMPRESSION: 1. No acute abnormality. 2.  Stable diffuse cerebral and cerebellar atrophy, chronic small vessel white matter ischemic changes and old left basal ganglia lacunar infarct. 3. Stable multilevel cervical spine degenerative changes and reversal of the normal cervical lordosis. 4. Bilateral carotid artery atheromatous calcifications. 5. Stable  bilateral thyroid nodules including a 2.4 cm dominant nodule on the left. Again, recommend further evaluation with elective thyroid ultrasound. If patient is clinically hyperthyroid, consider nuclear medicine thyroid uptake and scan.   Electronically Signed   By: Enrique Sack M.D.   On: 05/10/2014 19:06   Ct Cervical Spine Wo Contrast  05/10/2014   CLINICAL DATA:  Arm and rib pain following a fall 2 days ago.  EXAM: CT HEAD WITHOUT CONTRAST  CT CERVICAL SPINE WITHOUT CONTRAST  TECHNIQUE: Multidetector CT imaging of the head and cervical spine was performed following the standard protocol without intravenous contrast. Multiplanar CT image reconstructions of the cervical spine were also generated.  COMPARISON:  05/07/2014  FINDINGS: CT HEAD FINDINGS  Diffusely enlarged ventricles and subarachnoid spaces. Patchy white matter low density in both cerebral hemispheres. Stable old left basal ganglia lacunar infarct. No skull fracture, intracranial hemorrhage or paranasal sinus air-fluid levels.  CT CERVICAL SPINE FINDINGS  Multilevel degenerative changes and mild thoracolumbar scoliosis. Stable reversal of the normal cervical lordosis and mild anterior subluxation at the C4-5 level. No prevertebral soft tissue swelling, fractures or acute subluxations. Bilateral carotid artery calcifications. Bilateral thyroid masses are again demonstrated without significant change. These include a 2.4 x 1.6 cm left lobe mass on image number 61 and a 0.8 x 0.7 cm right lobe mass on image number 58.  IMPRESSION: 1. No acute abnormality. 2. Stable diffuse cerebral and cerebellar atrophy, chronic small vessel white matter ischemic changes and old left basal ganglia lacunar infarct. 3. Stable multilevel cervical spine degenerative changes and reversal of the normal cervical lordosis. 4. Bilateral carotid artery atheromatous calcifications. 5. Stable bilateral thyroid nodules including a 2.4 cm dominant nodule on the left. Again, recommend  further evaluation with elective thyroid ultrasound. If patient is clinically hyperthyroid, consider nuclear medicine thyroid uptake and scan.   Electronically Signed   By: Enrique Sack M.D.   On: 05/10/2014 19:06    Anti-infectives: Anti-infectives   None      Assessment/Plan: Fall R rib FX 5-7 - pulmonary toilet, pain control, doing almost 750 on IS this AM A fib Dementia VTE - resume coumadin To floor   LOS: 1 day    Georganna Skeans, MD, MPH, FACS Trauma: [phone] General Surgery: [phone]  05/11/2014

## 2014-05-12 DIAGNOSIS — W19XXXA Unspecified fall, initial encounter: Secondary | ICD-10-CM

## 2014-05-12 LAB — PROTIME-INR
INR: 2.07 — AB (ref 0.00–1.49)
PROTHROMBIN TIME: 23.3 s — AB (ref 11.6–15.2)

## 2014-05-12 LAB — URINE MICROSCOPIC-ADD ON

## 2014-05-12 LAB — URINALYSIS, ROUTINE W REFLEX MICROSCOPIC
Bilirubin Urine: NEGATIVE
Glucose, UA: NEGATIVE mg/dL
Hgb urine dipstick: NEGATIVE
Ketones, ur: NEGATIVE mg/dL
NITRITE: NEGATIVE
PH: 6 (ref 5.0–8.0)
Protein, ur: NEGATIVE mg/dL
SPECIFIC GRAVITY, URINE: 1.012 (ref 1.005–1.030)
UROBILINOGEN UA: 1 mg/dL (ref 0.0–1.0)

## 2014-05-12 MED ORDER — NAPROXEN 500 MG PO TABS
500.0000 mg | ORAL_TABLET | Freq: Two times a day (BID) | ORAL | Status: DC
  Administered 2014-05-12 – 2014-05-13 (×3): 500 mg via ORAL
  Filled 2014-05-12 (×5): qty 1

## 2014-05-12 MED ORDER — MAGNESIUM CITRATE PO SOLN
1.0000 | Freq: Once | ORAL | Status: DC
  Filled 2014-05-12: qty 296

## 2014-05-12 MED ORDER — DOCUSATE SODIUM 100 MG PO CAPS
200.0000 mg | ORAL_CAPSULE | Freq: Two times a day (BID) | ORAL | Status: DC
  Administered 2014-05-12 – 2014-05-13 (×3): 200 mg via ORAL
  Filled 2014-05-12 (×2): qty 2

## 2014-05-12 MED ORDER — POLYETHYLENE GLYCOL 3350 17 G PO PACK
17.0000 g | PACK | Freq: Every day | ORAL | Status: DC
  Administered 2014-05-12 – 2014-05-13 (×2): 17 g via ORAL
  Filled 2014-05-12 (×3): qty 1

## 2014-05-12 MED ORDER — TRAMADOL HCL 50 MG PO TABS
50.0000 mg | ORAL_TABLET | Freq: Four times a day (QID) | ORAL | Status: DC | PRN
  Administered 2014-05-12: 100 mg via ORAL
  Filled 2014-05-12: qty 2

## 2014-05-12 MED ORDER — MORPHINE SULFATE 2 MG/ML IJ SOLN: 2.0000 mg | INTRAMUSCULAR | Status: DC | PRN

## 2014-05-12 NOTE — Clinical Social Work Psychosocial (Signed)
Clinical Social Work Department BRIEF PSYCHOSOCIAL ASSESSMENT 05/12/2014  Patient:  Rhonda, Smith     Account Number:  [id number]     Admit date:  05/10/2014  Clinical Social Worker:  Wylene Men  Date/Time:  05/12/2014 11:47 AM  Referred by:  Physician  Date Referred:  05/12/2014 Referred for  SNF Placement  Psychosocial assessment   Other Referral:   none   Interview type:  Other - See comment Other interview type:   CSW spoke with main caregiver, niece, Rhonda Smith    PSYCHOSOCIAL DATA Living Status:  FACILITY Admitted from facility:  [facility] Level of care:  Independent Living Primary support name:  Rhonda Smith Primary support relationship to patient:  FAMILY Degree of support available:   adequate    CURRENT CONCERNS Current Concerns  Post-Acute Placement   Other Concerns:   none    SOCIAL WORK ASSESSMENT / PLAN Pt is alert to self and place only.  CSW completed assessment with neice, Rhonda Smith, who is pt's main caregiver - [phone]. Rhonda Smith states that pt is a resident at [facility].  Rhonda Smith has recently signed paperwork for pt to move to [facility], ALF, but her room is not ready and will not be ready until the end of the month.  CSW spoke with Rhonda Smith at [facility] who reports that Rhonda Smith can provide home health services to their residents.  RN from [facility] will complete face-to-face assessment to inform CSW and neice if Independent Living can accomodate pt's dc needs. Disposition pending RN assessment.    Plan A- dc to [facility] with their home health services following for care and move to ALF at the end of the month  Plan B- dc to SNF - first choice [facility], SNF (closest to [facility]) and from there dc to ALF at end of month if pt progresses according to plan.    Niece is overwhelmed with the fall and everything that accompanies the planning process, but feels prepared.   Assessment/plan status:  Psychosocial  Support/Ongoing Assessment of Needs Other assessment/ plan:   FL2  [facility]  Independent Living RN assessment   Information/referral to community resources:   possible- Independent Living HH (pending assessment)  SNF  ALF    PATIENT'S/FAMILY'S RESPONSE TO PLAN OF CARE: Rhonda Smith was very appreciative of CSW assistance and support. Rhonda Smith was agreeable to both Plan A and Plan B.  Pt projected to dc Tuesday 05/13/2014.       Rhonda Smith, [place] [phone]  Clinical Social Work

## 2014-05-12 NOTE — Evaluation (Addendum)
Occupational Therapy Evaluation Patient Details Name: Rhonda Smith MRN: [id number] DOB: 06/27/1925 Today's Date: 05/12/2014    History of Present Illness Rhonda Smith is an 80 y.o. female who was seen in the [facility] on 7/29 after a fall and who was sent home to return today for further workup.  CT today shows right sided rib fractures in an elderly lady on anticoagulants who fell.    Clinical Impression   Pt admitted with above. Pt independent with ADLs, PTA. Feel pt will benefit from acute OT to increase independence prior to d/c. Recommending HHOT upon d/c.    Follow Up Recommendations  Home health OT;Supervision/Assistance - 24 hour    Equipment Recommendations  None recommended by OT    Recommendations for Other Services       Precautions / Restrictions Precautions Precautions: Fall Precaution Comments: R 5th-7th rib fx Restrictions Weight Bearing Restrictions: No      Mobility Bed Mobility Overal bed mobility: Needs Assistance Bed Mobility: Rolling;Sidelying to Sit Rolling: Max assist Sidelying to sit: Max assist       General bed mobility comments: cues for technique. Very painful to perform.  Transfers Overall transfer level: Needs assistance Equipment used: Rolling walker (2 wheeled) Transfers: Sit to/from Stand Sit to Stand: Min guard         General transfer comment: cues for hand placement/technique.         ADL Overall ADL's : Needs assistance/impaired     Grooming: Wash/dry hands;Wash/dry face;Set up;Supervision/safety;Standing           Upper Body Dressing : Set up;Sitting   Lower Body Dressing: Min guard;Sit to/from stand   Toilet Transfer: Min guard;Ambulation;Regular Toilet;RW;Grab bars   Toileting- Clothing Manipulation and Hygiene: Min guard;Sit to/from stand       Functional mobility during ADLs: Min guard;Rolling walker General ADL Comments: Educated on safety tips for home (rugs, safe shoewear, use of bag on walker, sitting  for most of LB ADLs).  Recommended to have walker in front when pulling up LB clothing for safety. Educated on dressing technique for UB dressing and also use of button up shirts. Practiced simulated regular height toilet transfer.     Vision                     Perception     Praxis      Pertinent Vitals/Pain Pain 8/10. Increased activity during session.     Hand Dominance     Extremity/Trunk Assessment Upper Extremity Assessment Upper Extremity Assessment: RUE deficits/detail RUE Deficits / Details: Limited shoulder flexion in RUE due to previous injury; Rt hand/wrist badly bruised   Lower Extremity Assessment Lower Extremity Assessment: Defer to PT evaluation     Communication Communication Communication: No difficulties   Cognition Arousal/Alertness: Awake/alert Behavior During Therapy: WFL for tasks assessed/performed Overall Cognitive Status: Impaired/Different from baseline Area of Impairment: Orientation;Memory Orientation Level: Disoriented to;Place   Memory: Decreased short-term memory       General Comments: Pt with confusion during session, but very pleasant.   General Comments       Exercises       Shoulder Instructions      Home Living Family/patient expects to be discharged to:: Assisted living ([facility]) Living Arrangements: Alone Available Help at Discharge: Personal care attendant (family to hire 24/7 help at d/c) Type of Home: Apartment Home Access: Level entry     Home Layout: One level  Home Equipment: Rhonda Smith - single point;Other (comment); shower chair (has walk in shower and sink close to regular height toilet)   Additional Comments: Per pt and neice, was using 3wheeled walker when outdoors      Prior Functioning/Environment Level of Independence: Independent with assistive device(s) ; states she went to cafeteria for meals             OT Diagnosis: Acute pain   OT Problem List:  Decreased strength;Impaired balance (sitting and/or standing);Decreased knowledge of use of DME or AE;Decreased knowledge of precautions;Decreased cognition;Pain;Impaired UE functional use;Decreased range of motion   OT Treatment/Interventions: Self-care/ADL training;DME and/or AE instruction;Patient/family education;Balance training;Cognitive remediation/compensation;Therapeutic activities    OT Goals(Current goals can be found in the care plan section) Acute Rehab OT Goals Patient Stated Goal: not stated OT Goal Formulation: With patient/family Time For Goal Achievement: 05/19/14 Potential to Achieve Goals: Good ADL Goals Pt Will Transfer to Toilet: with modified independence;ambulating Pt Will Perform Toileting - Clothing Manipulation and hygiene: with modified independence;sit to/from stand Additional ADL Goal #1: Pt will perform bed mobility at supervision level as precursor for ADLs.  OT Frequency: Min 2X/week   Barriers to D/C:            Co-evaluation              End of Session Equipment Utilized During Treatment: Gait belt;Rolling walker  Activity Tolerance: Patient tolerated treatment well Patient left: in bed;with family/visitor present   Time: 1500-1537 OT Time Calculation (min): 37 min Charges:  OT General Charges $OT Visit: 1 Procedure OT Evaluation $Initial OT Evaluation Tier I: 1 Procedure OT Treatments $Self Care/Home Management : 8-22 mins G-CodesBenito Mccreedy OTR/L [phone] 05/12/2014, 4:58 PM

## 2014-05-12 NOTE — Progress Notes (Signed)
Pt has been up to bedside commode with one assist several times during the night. She has complained of rib pain less frequently  as the night progressed. She has been disoriented to place but is easily reoriented when fully awake.

## 2014-05-12 NOTE — Progress Notes (Signed)
CSW spoke with pt's niece, Ms. Cook re: d/c planning. Ms. Lacinda Axon has hired care givers through [employer] who will provide pt with 24 hour care x 1 week at [place].  No other CSW needs identified.  CSW signing off.

## 2014-05-12 NOTE — Progress Notes (Signed)
Patient ID: Rhonda Smith, female   DOB: 02-15-1925, 80 y.o.   MRN: [id number]   LOS: 2 days   Subjective: Doing ok, wants to go home. C/o constipation, RN notes pt urinating ~1h.   Objective: Vital signs in last 24 hours: Temp:  [97.4 F (36.3 C)-98.2 F (36.8 C)] 97.4 F (36.3 C) (08/03 0546) Pulse Rate:  [73-77] 75 (08/03 0546) Resp:  [16-18] 18 (08/03 0546) BP: (105-149)/(63-89) 149/64 mmHg (08/03 0546) SpO2:  [85 %-98 %] 94 % (08/03 0546) Last BM Date: 05/10/14   IS: 771ml (=)   Laboratory Results Lab Results  Component Value Date   INR 2.07* 05/12/2014   INR 2.25* 05/10/2014    Physical Exam General appearance: alert and no distress Resp: clear to auscultation bilaterally Cardio: regular rate and rhythm GI: normal findings: bowel sounds normal and soft, non-tender   Assessment/Plan: Fall  R rib FX 5-7 - pulmonary toilet A fib  Dementia  Urinary frequency -- Check UA FEN -- Increase bowel regimen, give mag citrate. Try to control pain with non-narcotics given age, will replace analgesic with tramadol, add NSAID VTE - resume coumadin  Dispo -- Pt is in independent living. Will get PT/OT consults to make sure that is still a safe discharge plan.    Lisette Abu, PA-C Pager: [phone] General Trauma PA Pager: [phone]  05/12/2014

## 2014-05-12 NOTE — Evaluation (Signed)
Physical Therapy Evaluation Patient Details Name: Rhonda Smith MRN: [id number] DOB: [birth year] Today's Date: 05/12/2014   History of Present Illness  Rhonda Smith is an 80 y.o. female who was seen in the [facility] on 7/29 after a fall and who was sent home to return today for further workup.  CT today shows right sided rib fractures in an elderly lady on anticoagulants who fell.   Clinical Impression  Pt presents with decreased balance and safety awareness. Note chart states history of dementia, however when asked family member, she states that she didn't think so, but she did often forget names and was worried about her handling her medications.  Pt tolerated ambulation and stair negotiation with RW.  Highly recommend that she use a RW and have 24/7 S/assist at D/C to ensure safety.  Niece in agreement.      Follow Up Recommendations Home health PT;Supervision/Assistance - 24 hour    Equipment Recommendations  Rolling walker with 5" wheels    Recommendations for Other Services       Precautions / Restrictions Precautions Precautions: Fall Precaution Comments: R 5th-7th rib fx Restrictions Weight Bearing Restrictions: No      Mobility  Bed Mobility               General bed mobility comments: Pt on EOB when PT arrived, however neice stated it was painful.   Transfers Overall transfer level: Needs assistance Equipment used: Rolling walker (2 wheeled) Transfers: Sit to/from Stand Sit to Stand: Min guard         General transfer comment: Min/guard for safety with max verbal cues for hand placement and ensuring she keeps RW with her all the way until seated.   Ambulation/Gait Ambulation/Gait assistance: Min assist;Min guard Ambulation Distance (Feet): 50 Feet (and another 100x 2) Assistive device: Rolling walker (2 wheeled) Gait Pattern/deviations: Step-to pattern;Narrow base of support;Trunk flexed Gait velocity: decreased   General Gait Details: Initially assessed gait  without RW and note pt tends to favor R LE with circumduction noted on LLE.  Note marked improvement with use of RW but does require min/guard for safety.    Stairs Stairs: Yes Stairs assistance: Min guard Stair Management: No rails;Step to pattern;Forwards;With walker Number of Stairs: 2 General stair comments: Provided education to pt and then to neice on how to perform single curb step as she will have to do in community.   Wheelchair Mobility    Modified Rankin (Stroke Patients Only)       Balance Overall balance assessment: Needs assistance Sitting-balance support: Feet supported Sitting balance-Leahy Scale: Good     Standing balance support: During functional activity Standing balance-Leahy Scale: Fair                               Pertinent Vitals/Pain Pt with only mild c/o pain in side during session.     Home Living Family/patient expects to be discharged to:: Assisted living (Independent Living facility ) Living Arrangements: Alone Available Help at Discharge: Personal care attendant (family to hire 24/7 help at D/C) Type of Home: Apartment Home Access: Level entry     Home Layout: One level [place]: [place] - single point;Other (comment) Additional Comments: Per pt and neice, was using 3wheeled walker when outdoors    Prior Function Level of Independence: Independent with assistive device(s)               Hand Dominance  Extremity/Trunk Assessment               Lower Extremity Assessment: Generalized weakness      Cervical / Trunk Assessment: Kyphotic  Communication      Cognition Arousal/Alertness: Awake/alert Behavior During Therapy: WFL for tasks assessed/performed Overall Cognitive Status: Impaired/Different from baseline Area of Impairment: Orientation;Memory;Awareness Orientation Level: Disoriented to;Place;Time;Situation   Memory: Decreased short-term memory     Awareness: Intellectual   General  Comments: Pt with intermittent confusion during session.  At one point could state she was at [facility], but unable to state why and later in session stated she was in [place], [place].     General Comments      Exercises        Assessment/Plan    PT Assessment Patient needs continued PT services  PT Diagnosis Difficulty walking;Generalized weakness   PT Problem List Decreased strength;Decreased activity tolerance;Decreased balance;Decreased mobility;Decreased cognition;Decreased knowledge of use of DME;Decreased safety awareness;Decreased knowledge of precautions  PT Treatment Interventions DME instruction;Gait training;Stair training;Functional mobility training;Therapeutic activities;Therapeutic exercise;Balance training;Patient/family education   PT Goals (Current goals can be found in the Care Plan section) Acute Rehab PT Goals Patient Stated Goal: to return home PT Goal Formulation: With patient/family Time For Goal Achievement: 05/19/14 Potential to Achieve Goals: Good    Frequency Min 3X/week   Barriers to discharge        Co-evaluation               End of Session Equipment Utilized During Treatment: Gait belt Activity Tolerance: Patient tolerated treatment well Patient left: in chair;with family/visitor present;with call bell/phone within reach Nurse Communication: Mobility status         Time: 6811-5726 PT Time Calculation (min): 30 min   Charges:   PT Evaluation $Initial PT Evaluation Tier I: 1 Procedure PT Treatments $Gait Training: 23-37 mins   PT G Codes:          Denice Bors 05/12/2014, 4:30 PM

## 2014-05-12 NOTE — Progress Notes (Signed)
Unbelievably sweet lady, seems a bit confused, doing well with IS.  Should be able to be discharged to assisted living facility.  This patient has been seen and I agree with the findings and treatment plan.  Kathryne Eriksson. Dahlia Bailiff, MD, [place] [phone] (pager) [phone] (direct pager) Trauma Surgeon

## 2014-05-13 LAB — PROTIME-INR
INR: 1.97 — ABNORMAL HIGH (ref 0.00–1.49)
Prothrombin Time: 22.4 s — ABNORMAL HIGH (ref 11.6–15.2)

## 2014-05-13 MED ORDER — TRAMADOL HCL 50 MG PO TABS: 50.0000 mg | ORAL_TABLET | Freq: Four times a day (QID) | ORAL | Status: DC | PRN

## 2014-05-13 MED ORDER — NAPROXEN 500 MG PO TABS: 500.0000 mg | ORAL_TABLET | Freq: Two times a day (BID) | ORAL | Status: DC

## 2014-05-13 NOTE — Discharge Instructions (Signed)
Increase activity as pain allows.  Use your walker whenever you are standing or walking.

## 2014-05-13 NOTE — Progress Notes (Signed)
Physical Therapy Treatment Patient Details Name: Rhonda Smith MRN: [id number] DOB: 11-05-1924 Today's Date: 05/13/2014    History of Present Illness Rhonda Smith is an 80 y.o. female who was seen in the [facility] on 7/29 after a fall and who was sent home to return today for further workup.  CT today shows right sided rib fractures in an elderly lady on anticoagulants who fell.     PT Comments    Patient was seen this morning as she was up wandering hallways without assistance. Patient continue with ambulation using SPC with Min A. Recommend RW for increased balance and safety. Patient was disoriented stating that she was at a schoolhouse and didn't know how she arrived bc the people she was with left her. Patient reoriented and returned to room in recliner with chair alarm on. Patient was very sweet and thankful. RN made aware. Continue to recommend HHPT at [facility].   Follow Up Recommendations  Home health PT;Supervision/Assistance - 24 hour     Equipment Recommendations  Rolling walker with 5" wheels    Recommendations for Other Services       Precautions / Restrictions Precautions Precautions: Fall Precaution Comments: R 5th-7th rib fx    Mobility  Bed Mobility                  Transfers Overall transfer level: Needs assistance Equipment used: Rolling walker (2 wheeled)   Sit to Stand: Min guard         General transfer comment: cues for hand placement/technique.  Ambulation/Gait Ambulation/Gait assistance: Min assist Ambulation Distance (Feet): 150 Feet Assistive device: Straight cane   Gait velocity: decreased   General Gait Details: Patient was found walking hallway with [place]. Patient had wandered out of her room. Patient required Min A for safety with balance. Recommend RW for further ambulation   Stairs            Wheelchair Mobility    Modified Rankin (Stroke Patients Only)       Balance             Standing balance-Leahy Scale: Fair                      Cognition Arousal/Alertness: Awake/alert Behavior During Therapy: WFL for tasks assessed/performed Overall Cognitive Status: Impaired/Different from baseline Area of Impairment: Orientation;Memory Orientation Level: Disoriented to;Place;Time;Situation   Memory: Decreased short-term memory         General Comments: Patient wandering hallway upon working with her. Patient asked if she was in a schoolhouse. Patient stated that she didn't know what was giong on with her and why she was here. Reoriented several time throughout session.     Exercises      General Comments        Pertinent Vitals/Pain Patient stated a little pain and pointed to ribs, but she did not rate    Home Living                      Prior Function            PT Goals (current goals can now be found in the care plan section) Progress towards PT goals: Progressing toward goals    Frequency  Min 3X/week    PT Plan Current plan remains appropriate    Co-evaluation             End of Session Equipment Utilized During Treatment: Gait belt Activity Tolerance: Patient  tolerated treatment well Patient left: in chair;with family/visitor present;with call bell/phone within reach;with chair alarm set     Time: [phone] PT Time Calculation (min): 19 min  Charges:  $Gait Training: 8-22 mins                    G Codes:      Jacqualyn Posey 05/13/2014, 9:02 AM 05/13/2014 Jacqualyn Posey PTA [phone] pager [phone] office

## 2014-05-13 NOTE — Progress Notes (Signed)
Patient ID: Rhonda Smith, female   DOB: December 20, 1924, 80 y.o.   MRN: [id number]   LOS: 3 days   Subjective: No new c/o. Ready to go home.   Objective: Vital signs in last 24 hours: Temp:  [98.2 F (36.8 C)-98.4 F (36.9 C)] 98.4 F (36.9 C) (08/04 0515) Pulse Rate:  [75-80] 80 (08/04 0515) Resp:  [18] 18 (08/04 0515) BP: (149-162)/(64-67) 162/67 mmHg (08/04 0515) SpO2:  [98 %-99 %] 98 % (08/04 0515) Last BM Date: 05/12/14   IS: 1040ml (+287ml)   Laboratory Results Urinalysis    Component Value Date/Time   COLORURINE YELLOW 05/12/2014 [place] 05/12/2014 1041   LABSPEC 1.012 05/12/2014 1041   PHURINE 6.0 05/12/2014 1041   [place] 05/12/2014 1041   [place] 05/12/2014 [place] 05/12/2014 [place] 05/12/2014 [place] 05/12/2014 1041   UROBILINOGEN 1.0 05/12/2014 1041   NITRITE NEGATIVE 05/12/2014 1041   LEUKOCYTESUR SMALL* 05/12/2014 1041   Lab Results  Component Value Date   INR 1.97* 05/13/2014   INR 2.07* 05/12/2014   INR 2.25* 05/10/2014    Physical Exam General appearance: alert and no distress Resp: clear to auscultation bilaterally Cardio: regular rate and rhythm GI: normal findings: bowel sounds normal and soft, non-tender   Assessment/Plan: Fall  R rib FX 5-7 - pulmonary toilet  A fib  Dementia  Urinary frequency -- UA WNL Dispo -- D/C home with 24h supervision    Lisette Abu, PA-C Pager: [phone] General Trauma PA Pager: [phone]  05/13/2014

## 2014-05-13 NOTE — Discharge Summary (Signed)
Physician Discharge Summary  Patient ID: Rhonda Smith MRN: [id number] DOB/AGE: 1925/01/17 80 y.o.  Admit date: 05/10/2014 Discharge date: 05/13/2014  Discharge Diagnoses Patient Active Problem List   Diagnosis Date Noted  . Fall 05/12/2014  . Rib fractures 05/10/2014  . Atrial fibrillation 07/11/2013  . Chronic systolic heart failure 17/91/5056  . Complete heart block 07/11/2013  . ICD (implantable cardioverter-defibrillator), biventricular, in situ 07/11/2013  . Dementia 07/11/2013    Consultants None   Procedures None   HPI: Rhonda Smith was seen in the [facility] on 7/29 after a fall and was sent home. She returned a couple of days later for further workup. A CT scan of the chest showed right sided rib fractures. She was admitted to the trauma service for pulmonary toilet.   Hospital Course: The patient did well in the hospital. Her pain was brought under control with oral medications. She did not suffer any respiratory compromise from her rib fractures. She was mobilized with physical and occupational therapies who recommended 24 assistance at home. The family was able to arrange that and she was discharged home in good condition.      Medication List         metoprolol succinate 25 MG 24 hr tablet  Commonly known as:  TOPROL-XL  Take 25 mg by mouth daily.     naproxen 500 MG tablet  Commonly known as:  NAPROSYN  Take 1 tablet (500 mg total) by mouth 2 (two) times daily with a meal.     traMADol 50 MG tablet  Commonly known as:  ULTRAM  Take 1-2 tablets (50-100 mg total) by mouth every 6 (six) hours as needed (Pain).     traZODone 50 MG tablet  Commonly known as:  DESYREL  Take 50-100 mg by mouth at bedtime.     warfarin 4 MG tablet  Commonly known as:  COUMADIN  Take 4 mg by mouth as directed. 4 mg daily with the exception of Thursdays             Follow-up Information   Schedule an appointment as soon as possible for a visit with Gerrit Heck, MD.   Specialty:  Family Medicine   Contact information:   [place] [address] [phone]       Call [place]. (As needed)    Contact information:   [address] [place] [address] [phone]       Signed: Lisette Abu, PA-C Pager: [phone] General Trauma PA Pager: [phone] 05/13/2014, 7:57 AM

## 2014-05-13 NOTE — Discharge Summary (Signed)
Rhyse Skowron, MD, MPH, FACS Trauma: [phone] General Surgery: [phone]  

## 2014-05-13 NOTE — Progress Notes (Signed)
Pt with orders for HHRN/PT/OT and rolling walker. Niece reports pt has walker already at home. Pt lives in [facility] independent living apartments and [facility] has the contract for these residents' home health needs.  Referral given to Shaune Leeks.  She spoke with pt and niece in room prior to pt leaving. Address and phone number in EPIC confirmed as correct.   Medicare IM (Important Message) delivered to patient today by me in anticipation of discharge.  Sandi Mariscal, RN BSN MHA CCM  Case Manager, Trauma Service/Unit 75M [phone]

## 2014-05-13 NOTE — Progress Notes (Signed)
Patient discharged to home accompanied by family. Discharge instructions and rx given and explained and patient/family stated understanding. IV was removed and patient left unit in a stable condition with all personal belongings via wheelchair.

## 2014-05-13 NOTE — Progress Notes (Signed)
D/C with 24h supervision Georganna Skeans, MD, MPH, FACS Trauma: [phone] General Surgery: [phone]

## 2014-05-15 DIAGNOSIS — I442 Atrioventricular block, complete: Secondary | ICD-10-CM | POA: Diagnosis not present

## 2014-05-15 DIAGNOSIS — S2241XD Multiple fractures of ribs, right side, subsequent encounter for fracture with routine healing: Secondary | ICD-10-CM | POA: Diagnosis not present

## 2014-05-15 DIAGNOSIS — I5022 Chronic systolic (congestive) heart failure: Secondary | ICD-10-CM | POA: Diagnosis not present

## 2014-05-15 DIAGNOSIS — R269 Unspecified abnormalities of gait and mobility: Secondary | ICD-10-CM | POA: Diagnosis not present

## 2014-05-15 DIAGNOSIS — I4891 Unspecified atrial fibrillation: Secondary | ICD-10-CM | POA: Diagnosis not present

## 2014-05-15 DIAGNOSIS — F0390 Unspecified dementia without behavioral disturbance: Secondary | ICD-10-CM | POA: Diagnosis not present

## 2014-05-16 ENCOUNTER — Telehealth (HOSPITAL_COMMUNITY): Payer: Self-pay | Admitting: Emergency Medicine

## 2014-05-16 DIAGNOSIS — I4891 Unspecified atrial fibrillation: Secondary | ICD-10-CM | POA: Diagnosis not present

## 2014-05-16 DIAGNOSIS — I442 Atrioventricular block, complete: Secondary | ICD-10-CM | POA: Diagnosis not present

## 2014-05-16 DIAGNOSIS — S2241XD Multiple fractures of ribs, right side, subsequent encounter for fracture with routine healing: Secondary | ICD-10-CM | POA: Diagnosis not present

## 2014-05-16 DIAGNOSIS — R269 Unspecified abnormalities of gait and mobility: Secondary | ICD-10-CM | POA: Diagnosis not present

## 2014-05-16 DIAGNOSIS — F0390 Unspecified dementia without behavioral disturbance: Secondary | ICD-10-CM | POA: Diagnosis not present

## 2014-05-16 DIAGNOSIS — I5022 Chronic systolic (congestive) heart failure: Secondary | ICD-10-CM | POA: Diagnosis not present

## 2014-05-16 NOTE — Telephone Encounter (Signed)
Spoke with daughter who said she was mistaken and her mother had been seen.

## 2014-05-19 DIAGNOSIS — I442 Atrioventricular block, complete: Secondary | ICD-10-CM | POA: Diagnosis not present

## 2014-05-19 DIAGNOSIS — F0390 Unspecified dementia without behavioral disturbance: Secondary | ICD-10-CM | POA: Diagnosis not present

## 2014-05-19 DIAGNOSIS — R269 Unspecified abnormalities of gait and mobility: Secondary | ICD-10-CM | POA: Diagnosis not present

## 2014-05-19 DIAGNOSIS — S2241XD Multiple fractures of ribs, right side, subsequent encounter for fracture with routine healing: Secondary | ICD-10-CM | POA: Diagnosis not present

## 2014-05-19 DIAGNOSIS — I4891 Unspecified atrial fibrillation: Secondary | ICD-10-CM | POA: Diagnosis not present

## 2014-05-19 DIAGNOSIS — I5022 Chronic systolic (congestive) heart failure: Secondary | ICD-10-CM | POA: Diagnosis not present

## 2014-05-20 DIAGNOSIS — R269 Unspecified abnormalities of gait and mobility: Secondary | ICD-10-CM | POA: Diagnosis not present

## 2014-05-20 DIAGNOSIS — F0390 Unspecified dementia without behavioral disturbance: Secondary | ICD-10-CM | POA: Diagnosis not present

## 2014-05-20 DIAGNOSIS — S2241XD Multiple fractures of ribs, right side, subsequent encounter for fracture with routine healing: Secondary | ICD-10-CM | POA: Diagnosis not present

## 2014-05-20 DIAGNOSIS — I442 Atrioventricular block, complete: Secondary | ICD-10-CM | POA: Diagnosis not present

## 2014-05-20 DIAGNOSIS — M79609 Pain in unspecified limb: Secondary | ICD-10-CM | POA: Diagnosis not present

## 2014-05-20 DIAGNOSIS — Z7901 Long term (current) use of anticoagulants: Secondary | ICD-10-CM | POA: Diagnosis not present

## 2014-05-20 DIAGNOSIS — S2239XA Fracture of one rib, unspecified side, initial encounter for closed fracture: Secondary | ICD-10-CM | POA: Diagnosis not present

## 2014-05-20 DIAGNOSIS — I4891 Unspecified atrial fibrillation: Secondary | ICD-10-CM | POA: Diagnosis not present

## 2014-05-20 DIAGNOSIS — Z9181 History of falling: Secondary | ICD-10-CM | POA: Diagnosis not present

## 2014-05-20 DIAGNOSIS — I5022 Chronic systolic (congestive) heart failure: Secondary | ICD-10-CM | POA: Diagnosis not present

## 2014-05-21 DIAGNOSIS — R269 Unspecified abnormalities of gait and mobility: Secondary | ICD-10-CM | POA: Diagnosis not present

## 2014-05-21 DIAGNOSIS — I5022 Chronic systolic (congestive) heart failure: Secondary | ICD-10-CM | POA: Diagnosis not present

## 2014-05-21 DIAGNOSIS — I442 Atrioventricular block, complete: Secondary | ICD-10-CM | POA: Diagnosis not present

## 2014-05-21 DIAGNOSIS — F0390 Unspecified dementia without behavioral disturbance: Secondary | ICD-10-CM | POA: Diagnosis not present

## 2014-05-21 DIAGNOSIS — S2241XD Multiple fractures of ribs, right side, subsequent encounter for fracture with routine healing: Secondary | ICD-10-CM | POA: Diagnosis not present

## 2014-05-21 DIAGNOSIS — I4891 Unspecified atrial fibrillation: Secondary | ICD-10-CM | POA: Diagnosis not present

## 2014-05-22 DIAGNOSIS — I5022 Chronic systolic (congestive) heart failure: Secondary | ICD-10-CM | POA: Diagnosis not present

## 2014-05-22 DIAGNOSIS — R269 Unspecified abnormalities of gait and mobility: Secondary | ICD-10-CM | POA: Diagnosis not present

## 2014-05-22 DIAGNOSIS — I4891 Unspecified atrial fibrillation: Secondary | ICD-10-CM | POA: Diagnosis not present

## 2014-05-22 DIAGNOSIS — I442 Atrioventricular block, complete: Secondary | ICD-10-CM | POA: Diagnosis not present

## 2014-05-22 DIAGNOSIS — S2241XD Multiple fractures of ribs, right side, subsequent encounter for fracture with routine healing: Secondary | ICD-10-CM | POA: Diagnosis not present

## 2014-05-22 DIAGNOSIS — F0390 Unspecified dementia without behavioral disturbance: Secondary | ICD-10-CM | POA: Diagnosis not present

## 2014-05-27 DIAGNOSIS — F0390 Unspecified dementia without behavioral disturbance: Secondary | ICD-10-CM | POA: Diagnosis not present

## 2014-05-27 DIAGNOSIS — I5022 Chronic systolic (congestive) heart failure: Secondary | ICD-10-CM | POA: Diagnosis not present

## 2014-05-27 DIAGNOSIS — I4891 Unspecified atrial fibrillation: Secondary | ICD-10-CM | POA: Diagnosis not present

## 2014-05-27 DIAGNOSIS — I442 Atrioventricular block, complete: Secondary | ICD-10-CM | POA: Diagnosis not present

## 2014-05-27 DIAGNOSIS — S2241XD Multiple fractures of ribs, right side, subsequent encounter for fracture with routine healing: Secondary | ICD-10-CM | POA: Diagnosis not present

## 2014-05-27 DIAGNOSIS — R269 Unspecified abnormalities of gait and mobility: Secondary | ICD-10-CM | POA: Diagnosis not present

## 2014-05-28 DIAGNOSIS — I5022 Chronic systolic (congestive) heart failure: Secondary | ICD-10-CM | POA: Diagnosis not present

## 2014-05-28 DIAGNOSIS — R269 Unspecified abnormalities of gait and mobility: Secondary | ICD-10-CM | POA: Diagnosis not present

## 2014-05-28 DIAGNOSIS — I442 Atrioventricular block, complete: Secondary | ICD-10-CM | POA: Diagnosis not present

## 2014-05-28 DIAGNOSIS — Z862 Personal history of diseases of the blood and blood-forming organs and certain disorders involving the immune mechanism: Secondary | ICD-10-CM | POA: Diagnosis not present

## 2014-05-28 DIAGNOSIS — E785 Hyperlipidemia, unspecified: Secondary | ICD-10-CM | POA: Diagnosis not present

## 2014-05-28 DIAGNOSIS — I4891 Unspecified atrial fibrillation: Secondary | ICD-10-CM | POA: Diagnosis not present

## 2014-05-28 DIAGNOSIS — Z79899 Other long term (current) drug therapy: Secondary | ICD-10-CM | POA: Diagnosis not present

## 2014-05-28 DIAGNOSIS — F0390 Unspecified dementia without behavioral disturbance: Secondary | ICD-10-CM | POA: Diagnosis not present

## 2014-05-28 DIAGNOSIS — S2241XD Multiple fractures of ribs, right side, subsequent encounter for fracture with routine healing: Secondary | ICD-10-CM | POA: Diagnosis not present

## 2014-05-29 DIAGNOSIS — S2241XD Multiple fractures of ribs, right side, subsequent encounter for fracture with routine healing: Secondary | ICD-10-CM | POA: Diagnosis not present

## 2014-05-29 DIAGNOSIS — I5022 Chronic systolic (congestive) heart failure: Secondary | ICD-10-CM | POA: Diagnosis not present

## 2014-05-29 DIAGNOSIS — I4891 Unspecified atrial fibrillation: Secondary | ICD-10-CM | POA: Diagnosis not present

## 2014-05-29 DIAGNOSIS — R269 Unspecified abnormalities of gait and mobility: Secondary | ICD-10-CM | POA: Diagnosis not present

## 2014-05-29 DIAGNOSIS — F0390 Unspecified dementia without behavioral disturbance: Secondary | ICD-10-CM | POA: Diagnosis not present

## 2014-05-29 DIAGNOSIS — I442 Atrioventricular block, complete: Secondary | ICD-10-CM | POA: Diagnosis not present

## 2014-05-30 ENCOUNTER — Ambulatory Visit: Payer: Medicare Other | Admitting: Cardiology

## 2014-06-03 DIAGNOSIS — I442 Atrioventricular block, complete: Secondary | ICD-10-CM | POA: Diagnosis not present

## 2014-06-03 DIAGNOSIS — R269 Unspecified abnormalities of gait and mobility: Secondary | ICD-10-CM | POA: Diagnosis not present

## 2014-06-03 DIAGNOSIS — S2241XD Multiple fractures of ribs, right side, subsequent encounter for fracture with routine healing: Secondary | ICD-10-CM | POA: Diagnosis not present

## 2014-06-03 DIAGNOSIS — I5022 Chronic systolic (congestive) heart failure: Secondary | ICD-10-CM | POA: Diagnosis not present

## 2014-06-03 DIAGNOSIS — F0390 Unspecified dementia without behavioral disturbance: Secondary | ICD-10-CM | POA: Diagnosis not present

## 2014-06-03 DIAGNOSIS — I4891 Unspecified atrial fibrillation: Secondary | ICD-10-CM | POA: Diagnosis not present

## 2014-06-04 DIAGNOSIS — S2241XD Multiple fractures of ribs, right side, subsequent encounter for fracture with routine healing: Secondary | ICD-10-CM | POA: Diagnosis not present

## 2014-06-04 DIAGNOSIS — R269 Unspecified abnormalities of gait and mobility: Secondary | ICD-10-CM | POA: Diagnosis not present

## 2014-06-04 DIAGNOSIS — I5022 Chronic systolic (congestive) heart failure: Secondary | ICD-10-CM | POA: Diagnosis not present

## 2014-06-04 DIAGNOSIS — I442 Atrioventricular block, complete: Secondary | ICD-10-CM | POA: Diagnosis not present

## 2014-06-04 DIAGNOSIS — F0390 Unspecified dementia without behavioral disturbance: Secondary | ICD-10-CM | POA: Diagnosis not present

## 2014-06-04 DIAGNOSIS — I4891 Unspecified atrial fibrillation: Secondary | ICD-10-CM | POA: Diagnosis not present

## 2014-06-05 DIAGNOSIS — I5022 Chronic systolic (congestive) heart failure: Secondary | ICD-10-CM | POA: Diagnosis not present

## 2014-06-05 DIAGNOSIS — S2241XD Multiple fractures of ribs, right side, subsequent encounter for fracture with routine healing: Secondary | ICD-10-CM | POA: Diagnosis not present

## 2014-06-05 DIAGNOSIS — I442 Atrioventricular block, complete: Secondary | ICD-10-CM | POA: Diagnosis not present

## 2014-06-05 DIAGNOSIS — I4891 Unspecified atrial fibrillation: Secondary | ICD-10-CM | POA: Diagnosis not present

## 2014-06-05 DIAGNOSIS — R269 Unspecified abnormalities of gait and mobility: Secondary | ICD-10-CM | POA: Diagnosis not present

## 2014-06-05 DIAGNOSIS — F0390 Unspecified dementia without behavioral disturbance: Secondary | ICD-10-CM | POA: Diagnosis not present

## 2014-06-10 DIAGNOSIS — I442 Atrioventricular block, complete: Secondary | ICD-10-CM | POA: Diagnosis not present

## 2014-06-10 DIAGNOSIS — I5022 Chronic systolic (congestive) heart failure: Secondary | ICD-10-CM | POA: Diagnosis not present

## 2014-06-10 DIAGNOSIS — F0390 Unspecified dementia without behavioral disturbance: Secondary | ICD-10-CM | POA: Diagnosis not present

## 2014-06-10 DIAGNOSIS — I4891 Unspecified atrial fibrillation: Secondary | ICD-10-CM | POA: Diagnosis not present

## 2014-06-10 DIAGNOSIS — S2241XD Multiple fractures of ribs, right side, subsequent encounter for fracture with routine healing: Secondary | ICD-10-CM | POA: Diagnosis not present

## 2014-06-10 DIAGNOSIS — R269 Unspecified abnormalities of gait and mobility: Secondary | ICD-10-CM | POA: Diagnosis not present

## 2014-06-11 DIAGNOSIS — I4891 Unspecified atrial fibrillation: Secondary | ICD-10-CM | POA: Diagnosis not present

## 2014-06-11 DIAGNOSIS — R269 Unspecified abnormalities of gait and mobility: Secondary | ICD-10-CM | POA: Diagnosis not present

## 2014-06-11 DIAGNOSIS — F0390 Unspecified dementia without behavioral disturbance: Secondary | ICD-10-CM | POA: Diagnosis not present

## 2014-06-11 DIAGNOSIS — I5022 Chronic systolic (congestive) heart failure: Secondary | ICD-10-CM | POA: Diagnosis not present

## 2014-06-11 DIAGNOSIS — S2241XD Multiple fractures of ribs, right side, subsequent encounter for fracture with routine healing: Secondary | ICD-10-CM | POA: Diagnosis not present

## 2014-06-11 DIAGNOSIS — I442 Atrioventricular block, complete: Secondary | ICD-10-CM | POA: Diagnosis not present

## 2014-06-13 DIAGNOSIS — I4891 Unspecified atrial fibrillation: Secondary | ICD-10-CM | POA: Diagnosis not present

## 2014-06-13 DIAGNOSIS — I5022 Chronic systolic (congestive) heart failure: Secondary | ICD-10-CM | POA: Diagnosis not present

## 2014-06-13 DIAGNOSIS — F0390 Unspecified dementia without behavioral disturbance: Secondary | ICD-10-CM | POA: Diagnosis not present

## 2014-06-13 DIAGNOSIS — S2241XD Multiple fractures of ribs, right side, subsequent encounter for fracture with routine healing: Secondary | ICD-10-CM | POA: Diagnosis not present

## 2014-06-13 DIAGNOSIS — R269 Unspecified abnormalities of gait and mobility: Secondary | ICD-10-CM | POA: Diagnosis not present

## 2014-06-13 DIAGNOSIS — I442 Atrioventricular block, complete: Secondary | ICD-10-CM | POA: Diagnosis not present

## 2014-06-17 DIAGNOSIS — R269 Unspecified abnormalities of gait and mobility: Secondary | ICD-10-CM | POA: Diagnosis not present

## 2014-06-17 DIAGNOSIS — I4891 Unspecified atrial fibrillation: Secondary | ICD-10-CM | POA: Diagnosis not present

## 2014-06-17 DIAGNOSIS — F0390 Unspecified dementia without behavioral disturbance: Secondary | ICD-10-CM | POA: Diagnosis not present

## 2014-06-17 DIAGNOSIS — I442 Atrioventricular block, complete: Secondary | ICD-10-CM | POA: Diagnosis not present

## 2014-06-17 DIAGNOSIS — S2241XD Multiple fractures of ribs, right side, subsequent encounter for fracture with routine healing: Secondary | ICD-10-CM | POA: Diagnosis not present

## 2014-06-17 DIAGNOSIS — I5022 Chronic systolic (congestive) heart failure: Secondary | ICD-10-CM | POA: Diagnosis not present

## 2014-06-18 DIAGNOSIS — I442 Atrioventricular block, complete: Secondary | ICD-10-CM | POA: Diagnosis not present

## 2014-06-18 DIAGNOSIS — R269 Unspecified abnormalities of gait and mobility: Secondary | ICD-10-CM | POA: Diagnosis not present

## 2014-06-18 DIAGNOSIS — I4891 Unspecified atrial fibrillation: Secondary | ICD-10-CM | POA: Diagnosis not present

## 2014-06-18 DIAGNOSIS — S2241XD Multiple fractures of ribs, right side, subsequent encounter for fracture with routine healing: Secondary | ICD-10-CM | POA: Diagnosis not present

## 2014-06-18 DIAGNOSIS — F0390 Unspecified dementia without behavioral disturbance: Secondary | ICD-10-CM | POA: Diagnosis not present

## 2014-06-18 DIAGNOSIS — I5022 Chronic systolic (congestive) heart failure: Secondary | ICD-10-CM | POA: Diagnosis not present

## 2014-06-19 DIAGNOSIS — I4891 Unspecified atrial fibrillation: Secondary | ICD-10-CM | POA: Diagnosis not present

## 2014-06-19 DIAGNOSIS — F0390 Unspecified dementia without behavioral disturbance: Secondary | ICD-10-CM | POA: Diagnosis not present

## 2014-06-19 DIAGNOSIS — I5022 Chronic systolic (congestive) heart failure: Secondary | ICD-10-CM | POA: Diagnosis not present

## 2014-06-19 DIAGNOSIS — R269 Unspecified abnormalities of gait and mobility: Secondary | ICD-10-CM | POA: Diagnosis not present

## 2014-06-19 DIAGNOSIS — S2241XD Multiple fractures of ribs, right side, subsequent encounter for fracture with routine healing: Secondary | ICD-10-CM | POA: Diagnosis not present

## 2014-06-19 DIAGNOSIS — I442 Atrioventricular block, complete: Secondary | ICD-10-CM | POA: Diagnosis not present

## 2014-06-20 DIAGNOSIS — F0390 Unspecified dementia without behavioral disturbance: Secondary | ICD-10-CM | POA: Diagnosis not present

## 2014-06-20 DIAGNOSIS — I442 Atrioventricular block, complete: Secondary | ICD-10-CM | POA: Diagnosis not present

## 2014-06-20 DIAGNOSIS — S2241XD Multiple fractures of ribs, right side, subsequent encounter for fracture with routine healing: Secondary | ICD-10-CM | POA: Diagnosis not present

## 2014-06-20 DIAGNOSIS — R269 Unspecified abnormalities of gait and mobility: Secondary | ICD-10-CM | POA: Diagnosis not present

## 2014-06-20 DIAGNOSIS — I4891 Unspecified atrial fibrillation: Secondary | ICD-10-CM | POA: Diagnosis not present

## 2014-06-20 DIAGNOSIS — I5022 Chronic systolic (congestive) heart failure: Secondary | ICD-10-CM | POA: Diagnosis not present

## 2014-06-23 DIAGNOSIS — I4891 Unspecified atrial fibrillation: Secondary | ICD-10-CM | POA: Diagnosis not present

## 2014-06-23 DIAGNOSIS — R269 Unspecified abnormalities of gait and mobility: Secondary | ICD-10-CM | POA: Diagnosis not present

## 2014-06-23 DIAGNOSIS — F0390 Unspecified dementia without behavioral disturbance: Secondary | ICD-10-CM | POA: Diagnosis not present

## 2014-06-23 DIAGNOSIS — I5022 Chronic systolic (congestive) heart failure: Secondary | ICD-10-CM | POA: Diagnosis not present

## 2014-06-23 DIAGNOSIS — I442 Atrioventricular block, complete: Secondary | ICD-10-CM | POA: Diagnosis not present

## 2014-06-23 DIAGNOSIS — S2241XD Multiple fractures of ribs, right side, subsequent encounter for fracture with routine healing: Secondary | ICD-10-CM | POA: Diagnosis not present

## 2014-06-24 DIAGNOSIS — F0390 Unspecified dementia without behavioral disturbance: Secondary | ICD-10-CM | POA: Diagnosis not present

## 2014-06-24 DIAGNOSIS — R269 Unspecified abnormalities of gait and mobility: Secondary | ICD-10-CM | POA: Diagnosis not present

## 2014-06-24 DIAGNOSIS — I4891 Unspecified atrial fibrillation: Secondary | ICD-10-CM | POA: Diagnosis not present

## 2014-06-24 DIAGNOSIS — I5022 Chronic systolic (congestive) heart failure: Secondary | ICD-10-CM | POA: Diagnosis not present

## 2014-06-24 DIAGNOSIS — S2241XD Multiple fractures of ribs, right side, subsequent encounter for fracture with routine healing: Secondary | ICD-10-CM | POA: Diagnosis not present

## 2014-06-24 DIAGNOSIS — I442 Atrioventricular block, complete: Secondary | ICD-10-CM | POA: Diagnosis not present

## 2014-06-25 DIAGNOSIS — I5022 Chronic systolic (congestive) heart failure: Secondary | ICD-10-CM | POA: Diagnosis not present

## 2014-06-25 DIAGNOSIS — R269 Unspecified abnormalities of gait and mobility: Secondary | ICD-10-CM | POA: Diagnosis not present

## 2014-06-25 DIAGNOSIS — I442 Atrioventricular block, complete: Secondary | ICD-10-CM | POA: Diagnosis not present

## 2014-06-25 DIAGNOSIS — I4891 Unspecified atrial fibrillation: Secondary | ICD-10-CM | POA: Diagnosis not present

## 2014-06-25 DIAGNOSIS — S2241XD Multiple fractures of ribs, right side, subsequent encounter for fracture with routine healing: Secondary | ICD-10-CM | POA: Diagnosis not present

## 2014-06-25 DIAGNOSIS — F0390 Unspecified dementia without behavioral disturbance: Secondary | ICD-10-CM | POA: Diagnosis not present

## 2014-06-26 DIAGNOSIS — R269 Unspecified abnormalities of gait and mobility: Secondary | ICD-10-CM | POA: Diagnosis not present

## 2014-06-26 DIAGNOSIS — I5022 Chronic systolic (congestive) heart failure: Secondary | ICD-10-CM | POA: Diagnosis not present

## 2014-06-26 DIAGNOSIS — F0390 Unspecified dementia without behavioral disturbance: Secondary | ICD-10-CM | POA: Diagnosis not present

## 2014-06-26 DIAGNOSIS — S2241XD Multiple fractures of ribs, right side, subsequent encounter for fracture with routine healing: Secondary | ICD-10-CM | POA: Diagnosis not present

## 2014-06-26 DIAGNOSIS — I4891 Unspecified atrial fibrillation: Secondary | ICD-10-CM | POA: Diagnosis not present

## 2014-06-26 DIAGNOSIS — I442 Atrioventricular block, complete: Secondary | ICD-10-CM | POA: Diagnosis not present

## 2014-06-30 DIAGNOSIS — I4891 Unspecified atrial fibrillation: Secondary | ICD-10-CM | POA: Diagnosis not present

## 2014-06-30 DIAGNOSIS — F0390 Unspecified dementia without behavioral disturbance: Secondary | ICD-10-CM | POA: Diagnosis not present

## 2014-06-30 DIAGNOSIS — I5022 Chronic systolic (congestive) heart failure: Secondary | ICD-10-CM | POA: Diagnosis not present

## 2014-06-30 DIAGNOSIS — S2241XD Multiple fractures of ribs, right side, subsequent encounter for fracture with routine healing: Secondary | ICD-10-CM | POA: Diagnosis not present

## 2014-06-30 DIAGNOSIS — R269 Unspecified abnormalities of gait and mobility: Secondary | ICD-10-CM | POA: Diagnosis not present

## 2014-06-30 DIAGNOSIS — I442 Atrioventricular block, complete: Secondary | ICD-10-CM | POA: Diagnosis not present

## 2014-07-01 DIAGNOSIS — I442 Atrioventricular block, complete: Secondary | ICD-10-CM | POA: Diagnosis not present

## 2014-07-01 DIAGNOSIS — F0390 Unspecified dementia without behavioral disturbance: Secondary | ICD-10-CM | POA: Diagnosis not present

## 2014-07-01 DIAGNOSIS — I4891 Unspecified atrial fibrillation: Secondary | ICD-10-CM | POA: Diagnosis not present

## 2014-07-01 DIAGNOSIS — R269 Unspecified abnormalities of gait and mobility: Secondary | ICD-10-CM | POA: Diagnosis not present

## 2014-07-01 DIAGNOSIS — I5022 Chronic systolic (congestive) heart failure: Secondary | ICD-10-CM | POA: Diagnosis not present

## 2014-07-01 DIAGNOSIS — S2241XD Multiple fractures of ribs, right side, subsequent encounter for fracture with routine healing: Secondary | ICD-10-CM | POA: Diagnosis not present

## 2014-07-02 DIAGNOSIS — I5022 Chronic systolic (congestive) heart failure: Secondary | ICD-10-CM | POA: Diagnosis not present

## 2014-07-02 DIAGNOSIS — R269 Unspecified abnormalities of gait and mobility: Secondary | ICD-10-CM | POA: Diagnosis not present

## 2014-07-02 DIAGNOSIS — S2241XD Multiple fractures of ribs, right side, subsequent encounter for fracture with routine healing: Secondary | ICD-10-CM | POA: Diagnosis not present

## 2014-07-02 DIAGNOSIS — F0390 Unspecified dementia without behavioral disturbance: Secondary | ICD-10-CM | POA: Diagnosis not present

## 2014-07-02 DIAGNOSIS — I442 Atrioventricular block, complete: Secondary | ICD-10-CM | POA: Diagnosis not present

## 2014-07-02 DIAGNOSIS — I4891 Unspecified atrial fibrillation: Secondary | ICD-10-CM | POA: Diagnosis not present

## 2014-07-03 DIAGNOSIS — S2241XD Multiple fractures of ribs, right side, subsequent encounter for fracture with routine healing: Secondary | ICD-10-CM | POA: Diagnosis not present

## 2014-07-03 DIAGNOSIS — F0390 Unspecified dementia without behavioral disturbance: Secondary | ICD-10-CM | POA: Diagnosis not present

## 2014-07-03 DIAGNOSIS — I4891 Unspecified atrial fibrillation: Secondary | ICD-10-CM | POA: Diagnosis not present

## 2014-07-03 DIAGNOSIS — I5022 Chronic systolic (congestive) heart failure: Secondary | ICD-10-CM | POA: Diagnosis not present

## 2014-07-03 DIAGNOSIS — Z7901 Long term (current) use of anticoagulants: Secondary | ICD-10-CM | POA: Diagnosis not present

## 2014-07-03 DIAGNOSIS — R269 Unspecified abnormalities of gait and mobility: Secondary | ICD-10-CM | POA: Diagnosis not present

## 2014-07-03 DIAGNOSIS — I442 Atrioventricular block, complete: Secondary | ICD-10-CM | POA: Diagnosis not present

## 2014-07-07 ENCOUNTER — Ambulatory Visit: Payer: Medicare Other | Admitting: Cardiology

## 2014-07-07 DIAGNOSIS — R269 Unspecified abnormalities of gait and mobility: Secondary | ICD-10-CM | POA: Diagnosis not present

## 2014-07-07 DIAGNOSIS — S2241XD Multiple fractures of ribs, right side, subsequent encounter for fracture with routine healing: Secondary | ICD-10-CM | POA: Diagnosis not present

## 2014-07-07 DIAGNOSIS — I442 Atrioventricular block, complete: Secondary | ICD-10-CM | POA: Diagnosis not present

## 2014-07-07 DIAGNOSIS — I5022 Chronic systolic (congestive) heart failure: Secondary | ICD-10-CM | POA: Diagnosis not present

## 2014-07-07 DIAGNOSIS — I4891 Unspecified atrial fibrillation: Secondary | ICD-10-CM | POA: Diagnosis not present

## 2014-07-07 DIAGNOSIS — F0390 Unspecified dementia without behavioral disturbance: Secondary | ICD-10-CM | POA: Diagnosis not present

## 2014-07-10 ENCOUNTER — Ambulatory Visit (INDEPENDENT_AMBULATORY_CARE_PROVIDER_SITE_OTHER): Payer: Medicare Other | Admitting: Nurse Practitioner

## 2014-07-10 ENCOUNTER — Encounter: Payer: Self-pay | Admitting: Nurse Practitioner

## 2014-07-10 VITALS — BP 130/82 | HR 75 | Ht 63.0 in | Wt 146.0 lb

## 2014-07-10 DIAGNOSIS — I4891 Unspecified atrial fibrillation: Secondary | ICD-10-CM

## 2014-07-10 DIAGNOSIS — I255 Ischemic cardiomyopathy: Secondary | ICD-10-CM

## 2014-07-10 DIAGNOSIS — I2589 Other forms of chronic ischemic heart disease: Secondary | ICD-10-CM

## 2014-07-10 DIAGNOSIS — I442 Atrioventricular block, complete: Secondary | ICD-10-CM | POA: Diagnosis not present

## 2014-07-10 LAB — PROTIME-INR
INR: 3.1 ratio — AB (ref 0.8–1.0)
PROTHROMBIN TIME: 33.5 s — AB (ref 9.6–13.1)

## 2014-07-10 NOTE — Progress Notes (Signed)
Patient Name: Rhonda Smith Date of Encounter: 07/10/2014  Primary Care Provider:  Gerrit Heck, MD Primary Cardiologist:  Jerilynn Mages. Marlou Porch, MD   Patient Profile  80 y/o female with a h/o ICM with normalization of LV fxn, and PAF, who presents for f/u.  Problem List   Past Medical History  Diagnosis Date  . PAF (paroxysmal atrial fibrillation)     a. chronic coumadin;  b. 10/2013 amio discontinued.  . Edema leg   . Alzheimer's disease   . Stasis dermatitis   . Myocardial infarction     a. 2008.  Marland Kitchen History of ischemic cardiomyopathy     a. 06/2008 s/p BSX N118 Cognis 100-D Bi-V ICD, ser # [id number];  b. 04/2013 Echo: EF 50-55%, no RWMA, Gr 2 DD, trace MR, mild to mod AI, mild Ao root dil (3.8cm @ Sinus of Valsalva);  c. 10/2013 ICD therapies turned off.  . Complete heart block     a. 06/2008 s/p CRT-D.  . Mild aortic insufficiency     a. 04/2013 Echo: Mild - Mod AI.  Marland Kitchen Right rib fracture     a. 05/2014 in setting of fall.   Past Surgical History  Procedure Laterality Date  . Knee arthroscopy      right  . Pacemaker insertion    . Total shoulder replacement      Allergies  Allergies  Allergen Reactions  . Sulfa Antibiotics Other (See Comments)    Unknown reaction    HPI  80 y/o female with the above problem list.  She is s/p MI in 2008 with LV dysfxn after.  A BSX CRT-D was placed in 2009 and LV fxn normalized by echo in 04/2013.  She was last seen in clinic in 10/2013 @ which time it was decided to turn off ICD therapies.  She was noted to be in afib 60% of the time on device interrogation and decision was also made to wean down amiodarone.  Pts dtr has subsequently weaned amio completely off.    Ms. Kowaleski moved into assisted living about a month ago.  She has been doing really well w/o chest pain, dyspnea, or palpitations.  She has been partaking in physical therapy @ assisted living and her dtr has noted improved strength and gait.  She denies pnd, orthopnea, n, v,  dizziness, syncope, edema, weight gain, or early satiety.   Home Medications  Prior to Admission medications   Medication Sig Start Date End Date Taking? Authorizing Provider  metoprolol succinate (TOPROL-XL) 25 MG 24 hr tablet Take 25 mg by mouth daily.   Yes Historical Provider, MD  traZODone (DESYREL) 50 MG tablet Take 50-100 mg by mouth at bedtime.   Yes Historical Provider, MD  warfarin (COUMADIN) 4 MG tablet Take 4 mg by mouth as directed. 4 mg daily with the exception of Thursdays   Yes Historical Provider, MD    Review of Systems  As above, pt has been doing exceptionally well.  All other systems reviewed and are otherwise negative except as noted above.  Physical Exam  Blood pressure 130/82, pulse 75, height 5\' 3"  (1.6 m), weight 146 lb (66.225 kg).  General: Pleasant, NAD Psych: Normal affect. Neuro: Alert and oriented X 3. Moves all extremities spontaneously. HEENT: Normal  Neck: Supple without bruits or JVD. Lungs:  Resp regular and unlabored, CTA. Heart: RRR no s3, s4, 1/6 SEM @ RUSB. Abdomen: Soft, non-tender, non-distended, BS + x 4.  Extremities: No clubbing, cyanosis or edema. DP/PT/Radials  2+ and equal bilaterally.  Accessory Clinical Findings  ECG - V paced, underlying afib, 75.  Assessment & Plan  1.  PAF:  Pt is asymptomatic.  ECG today shows V paced rhythm with underlying afib.  She came off of amio since her last clinic visit and remains anticoagulated on coumadin.  INR's are followed by her PCP and family has asked if we can check an INR today, as she is due.  She has been doing well and is asymptomatic w/o palpitations, dyspnea, or presyncope.  Cont  BB and coumadin.  2.  H/O ICM:  Nl LV fxn by echo 04/2013.  Euvolemic on exam.  Cont BB.  ICD therapies turned off in 10/2013.  3.  CHB:  Paced today.  4.  Mild to Mod AI:  Stable clinically.  Last echo 04/2013.  5.  Dispo:  F/U Dr. Marlou Porch in 6 mos or sooner if necessary.   Murray Hodgkins,  NP 07/10/2014, 3:10 PM

## 2014-07-10 NOTE — Patient Instructions (Signed)
Your physician recommends that you continue on your current medications as directed. Please refer to the Current Medication list given to you today.  Your physician recommends that you go to the lab today for a PT/INR and we will forward results to PCP.  Your physician wants you to follow-up in: 6 months with Dr Dawna Part will receive a reminder letter in the mail two months in advance. If you don't receive a letter, please call our office to schedule the follow-up appointment.

## 2014-07-11 DIAGNOSIS — I4891 Unspecified atrial fibrillation: Secondary | ICD-10-CM | POA: Diagnosis not present

## 2014-07-11 DIAGNOSIS — R269 Unspecified abnormalities of gait and mobility: Secondary | ICD-10-CM | POA: Diagnosis not present

## 2014-07-11 DIAGNOSIS — I5022 Chronic systolic (congestive) heart failure: Secondary | ICD-10-CM | POA: Diagnosis not present

## 2014-07-11 DIAGNOSIS — S2241XD Multiple fractures of ribs, right side, subsequent encounter for fracture with routine healing: Secondary | ICD-10-CM | POA: Diagnosis not present

## 2014-07-11 DIAGNOSIS — F0390 Unspecified dementia without behavioral disturbance: Secondary | ICD-10-CM | POA: Diagnosis not present

## 2014-07-11 DIAGNOSIS — I442 Atrioventricular block, complete: Secondary | ICD-10-CM | POA: Diagnosis not present

## 2014-07-23 DIAGNOSIS — Z23 Encounter for immunization: Secondary | ICD-10-CM | POA: Diagnosis not present

## 2014-08-12 DIAGNOSIS — M6281 Muscle weakness (generalized): Secondary | ICD-10-CM | POA: Diagnosis not present

## 2014-08-12 DIAGNOSIS — R262 Difficulty in walking, not elsewhere classified: Secondary | ICD-10-CM | POA: Diagnosis not present

## 2014-08-12 DIAGNOSIS — R279 Unspecified lack of coordination: Secondary | ICD-10-CM | POA: Diagnosis not present

## 2014-08-13 DIAGNOSIS — R279 Unspecified lack of coordination: Secondary | ICD-10-CM | POA: Diagnosis not present

## 2014-08-13 DIAGNOSIS — M6281 Muscle weakness (generalized): Secondary | ICD-10-CM | POA: Diagnosis not present

## 2014-08-13 DIAGNOSIS — R262 Difficulty in walking, not elsewhere classified: Secondary | ICD-10-CM | POA: Diagnosis not present

## 2014-08-14 DIAGNOSIS — R262 Difficulty in walking, not elsewhere classified: Secondary | ICD-10-CM | POA: Diagnosis not present

## 2014-08-14 DIAGNOSIS — R279 Unspecified lack of coordination: Secondary | ICD-10-CM | POA: Diagnosis not present

## 2014-08-14 DIAGNOSIS — M6281 Muscle weakness (generalized): Secondary | ICD-10-CM | POA: Diagnosis not present

## 2014-08-15 DIAGNOSIS — M6281 Muscle weakness (generalized): Secondary | ICD-10-CM | POA: Diagnosis not present

## 2014-08-15 DIAGNOSIS — R262 Difficulty in walking, not elsewhere classified: Secondary | ICD-10-CM | POA: Diagnosis not present

## 2014-08-15 DIAGNOSIS — R279 Unspecified lack of coordination: Secondary | ICD-10-CM | POA: Diagnosis not present

## 2014-08-18 DIAGNOSIS — M6281 Muscle weakness (generalized): Secondary | ICD-10-CM | POA: Diagnosis not present

## 2014-08-18 DIAGNOSIS — R279 Unspecified lack of coordination: Secondary | ICD-10-CM | POA: Diagnosis not present

## 2014-08-18 DIAGNOSIS — R262 Difficulty in walking, not elsewhere classified: Secondary | ICD-10-CM | POA: Diagnosis not present

## 2014-08-19 DIAGNOSIS — R279 Unspecified lack of coordination: Secondary | ICD-10-CM | POA: Diagnosis not present

## 2014-08-19 DIAGNOSIS — M6281 Muscle weakness (generalized): Secondary | ICD-10-CM | POA: Diagnosis not present

## 2014-08-19 DIAGNOSIS — R262 Difficulty in walking, not elsewhere classified: Secondary | ICD-10-CM | POA: Diagnosis not present

## 2014-08-20 DIAGNOSIS — R262 Difficulty in walking, not elsewhere classified: Secondary | ICD-10-CM | POA: Diagnosis not present

## 2014-08-20 DIAGNOSIS — R279 Unspecified lack of coordination: Secondary | ICD-10-CM | POA: Diagnosis not present

## 2014-08-20 DIAGNOSIS — M6281 Muscle weakness (generalized): Secondary | ICD-10-CM | POA: Diagnosis not present

## 2014-08-21 DIAGNOSIS — R262 Difficulty in walking, not elsewhere classified: Secondary | ICD-10-CM | POA: Diagnosis not present

## 2014-08-21 DIAGNOSIS — R279 Unspecified lack of coordination: Secondary | ICD-10-CM | POA: Diagnosis not present

## 2014-08-21 DIAGNOSIS — M6281 Muscle weakness (generalized): Secondary | ICD-10-CM | POA: Diagnosis not present

## 2014-08-25 DIAGNOSIS — R262 Difficulty in walking, not elsewhere classified: Secondary | ICD-10-CM | POA: Diagnosis not present

## 2014-08-25 DIAGNOSIS — M6281 Muscle weakness (generalized): Secondary | ICD-10-CM | POA: Diagnosis not present

## 2014-08-25 DIAGNOSIS — R279 Unspecified lack of coordination: Secondary | ICD-10-CM | POA: Diagnosis not present

## 2014-08-26 DIAGNOSIS — M6281 Muscle weakness (generalized): Secondary | ICD-10-CM | POA: Diagnosis not present

## 2014-08-26 DIAGNOSIS — R262 Difficulty in walking, not elsewhere classified: Secondary | ICD-10-CM | POA: Diagnosis not present

## 2014-08-26 DIAGNOSIS — R279 Unspecified lack of coordination: Secondary | ICD-10-CM | POA: Diagnosis not present

## 2014-08-27 DIAGNOSIS — M6281 Muscle weakness (generalized): Secondary | ICD-10-CM | POA: Diagnosis not present

## 2014-08-27 DIAGNOSIS — R279 Unspecified lack of coordination: Secondary | ICD-10-CM | POA: Diagnosis not present

## 2014-08-27 DIAGNOSIS — R262 Difficulty in walking, not elsewhere classified: Secondary | ICD-10-CM | POA: Diagnosis not present

## 2014-08-28 DIAGNOSIS — R262 Difficulty in walking, not elsewhere classified: Secondary | ICD-10-CM | POA: Diagnosis not present

## 2014-08-28 DIAGNOSIS — M6281 Muscle weakness (generalized): Secondary | ICD-10-CM | POA: Diagnosis not present

## 2014-08-28 DIAGNOSIS — R279 Unspecified lack of coordination: Secondary | ICD-10-CM | POA: Diagnosis not present

## 2014-09-01 DIAGNOSIS — R262 Difficulty in walking, not elsewhere classified: Secondary | ICD-10-CM | POA: Diagnosis not present

## 2014-09-01 DIAGNOSIS — R279 Unspecified lack of coordination: Secondary | ICD-10-CM | POA: Diagnosis not present

## 2014-09-01 DIAGNOSIS — M6281 Muscle weakness (generalized): Secondary | ICD-10-CM | POA: Diagnosis not present

## 2014-09-02 DIAGNOSIS — I4891 Unspecified atrial fibrillation: Secondary | ICD-10-CM | POA: Diagnosis not present

## 2014-09-02 DIAGNOSIS — M6281 Muscle weakness (generalized): Secondary | ICD-10-CM | POA: Diagnosis not present

## 2014-09-02 DIAGNOSIS — Z7901 Long term (current) use of anticoagulants: Secondary | ICD-10-CM | POA: Diagnosis not present

## 2014-09-02 DIAGNOSIS — R262 Difficulty in walking, not elsewhere classified: Secondary | ICD-10-CM | POA: Diagnosis not present

## 2014-09-02 DIAGNOSIS — R279 Unspecified lack of coordination: Secondary | ICD-10-CM | POA: Diagnosis not present

## 2014-09-03 DIAGNOSIS — R262 Difficulty in walking, not elsewhere classified: Secondary | ICD-10-CM | POA: Diagnosis not present

## 2014-09-03 DIAGNOSIS — R279 Unspecified lack of coordination: Secondary | ICD-10-CM | POA: Diagnosis not present

## 2014-09-03 DIAGNOSIS — M6281 Muscle weakness (generalized): Secondary | ICD-10-CM | POA: Diagnosis not present

## 2014-09-08 DIAGNOSIS — M6281 Muscle weakness (generalized): Secondary | ICD-10-CM | POA: Diagnosis not present

## 2014-09-08 DIAGNOSIS — R262 Difficulty in walking, not elsewhere classified: Secondary | ICD-10-CM | POA: Diagnosis not present

## 2014-09-08 DIAGNOSIS — R279 Unspecified lack of coordination: Secondary | ICD-10-CM | POA: Diagnosis not present

## 2014-09-09 DIAGNOSIS — M6281 Muscle weakness (generalized): Secondary | ICD-10-CM | POA: Diagnosis not present

## 2014-09-09 DIAGNOSIS — R279 Unspecified lack of coordination: Secondary | ICD-10-CM | POA: Diagnosis not present

## 2014-09-09 DIAGNOSIS — R278 Other lack of coordination: Secondary | ICD-10-CM | POA: Diagnosis not present

## 2014-09-09 DIAGNOSIS — R262 Difficulty in walking, not elsewhere classified: Secondary | ICD-10-CM | POA: Diagnosis not present

## 2014-09-10 DIAGNOSIS — R278 Other lack of coordination: Secondary | ICD-10-CM | POA: Diagnosis not present

## 2014-09-10 DIAGNOSIS — R262 Difficulty in walking, not elsewhere classified: Secondary | ICD-10-CM | POA: Diagnosis not present

## 2014-09-10 DIAGNOSIS — M6281 Muscle weakness (generalized): Secondary | ICD-10-CM | POA: Diagnosis not present

## 2014-09-10 DIAGNOSIS — R279 Unspecified lack of coordination: Secondary | ICD-10-CM | POA: Diagnosis not present

## 2014-09-11 DIAGNOSIS — R279 Unspecified lack of coordination: Secondary | ICD-10-CM | POA: Diagnosis not present

## 2014-09-11 DIAGNOSIS — R262 Difficulty in walking, not elsewhere classified: Secondary | ICD-10-CM | POA: Diagnosis not present

## 2014-09-11 DIAGNOSIS — R278 Other lack of coordination: Secondary | ICD-10-CM | POA: Diagnosis not present

## 2014-09-11 DIAGNOSIS — M6281 Muscle weakness (generalized): Secondary | ICD-10-CM | POA: Diagnosis not present

## 2014-09-12 DIAGNOSIS — R278 Other lack of coordination: Secondary | ICD-10-CM | POA: Diagnosis not present

## 2014-09-12 DIAGNOSIS — M6281 Muscle weakness (generalized): Secondary | ICD-10-CM | POA: Diagnosis not present

## 2014-09-12 DIAGNOSIS — R262 Difficulty in walking, not elsewhere classified: Secondary | ICD-10-CM | POA: Diagnosis not present

## 2014-09-12 DIAGNOSIS — R279 Unspecified lack of coordination: Secondary | ICD-10-CM | POA: Diagnosis not present

## 2014-09-16 DIAGNOSIS — R262 Difficulty in walking, not elsewhere classified: Secondary | ICD-10-CM | POA: Diagnosis not present

## 2014-09-16 DIAGNOSIS — R278 Other lack of coordination: Secondary | ICD-10-CM | POA: Diagnosis not present

## 2014-09-16 DIAGNOSIS — M6281 Muscle weakness (generalized): Secondary | ICD-10-CM | POA: Diagnosis not present

## 2014-09-16 DIAGNOSIS — R279 Unspecified lack of coordination: Secondary | ICD-10-CM | POA: Diagnosis not present

## 2014-09-17 DIAGNOSIS — R279 Unspecified lack of coordination: Secondary | ICD-10-CM | POA: Diagnosis not present

## 2014-09-17 DIAGNOSIS — R278 Other lack of coordination: Secondary | ICD-10-CM | POA: Diagnosis not present

## 2014-09-17 DIAGNOSIS — M6281 Muscle weakness (generalized): Secondary | ICD-10-CM | POA: Diagnosis not present

## 2014-09-17 DIAGNOSIS — R262 Difficulty in walking, not elsewhere classified: Secondary | ICD-10-CM | POA: Diagnosis not present

## 2014-09-18 DIAGNOSIS — R279 Unspecified lack of coordination: Secondary | ICD-10-CM | POA: Diagnosis not present

## 2014-09-18 DIAGNOSIS — R262 Difficulty in walking, not elsewhere classified: Secondary | ICD-10-CM | POA: Diagnosis not present

## 2014-09-18 DIAGNOSIS — R278 Other lack of coordination: Secondary | ICD-10-CM | POA: Diagnosis not present

## 2014-09-18 DIAGNOSIS — M6281 Muscle weakness (generalized): Secondary | ICD-10-CM | POA: Diagnosis not present

## 2014-09-19 DIAGNOSIS — R278 Other lack of coordination: Secondary | ICD-10-CM | POA: Diagnosis not present

## 2014-09-19 DIAGNOSIS — M6281 Muscle weakness (generalized): Secondary | ICD-10-CM | POA: Diagnosis not present

## 2014-09-19 DIAGNOSIS — R262 Difficulty in walking, not elsewhere classified: Secondary | ICD-10-CM | POA: Diagnosis not present

## 2014-09-19 DIAGNOSIS — R279 Unspecified lack of coordination: Secondary | ICD-10-CM | POA: Diagnosis not present

## 2014-09-23 DIAGNOSIS — M6281 Muscle weakness (generalized): Secondary | ICD-10-CM | POA: Diagnosis not present

## 2014-09-23 DIAGNOSIS — R262 Difficulty in walking, not elsewhere classified: Secondary | ICD-10-CM | POA: Diagnosis not present

## 2014-09-23 DIAGNOSIS — R278 Other lack of coordination: Secondary | ICD-10-CM | POA: Diagnosis not present

## 2014-09-23 DIAGNOSIS — R279 Unspecified lack of coordination: Secondary | ICD-10-CM | POA: Diagnosis not present

## 2014-09-25 DIAGNOSIS — R278 Other lack of coordination: Secondary | ICD-10-CM | POA: Diagnosis not present

## 2014-09-25 DIAGNOSIS — R262 Difficulty in walking, not elsewhere classified: Secondary | ICD-10-CM | POA: Diagnosis not present

## 2014-09-25 DIAGNOSIS — M6281 Muscle weakness (generalized): Secondary | ICD-10-CM | POA: Diagnosis not present

## 2014-09-25 DIAGNOSIS — R279 Unspecified lack of coordination: Secondary | ICD-10-CM | POA: Diagnosis not present

## 2014-09-26 DIAGNOSIS — R278 Other lack of coordination: Secondary | ICD-10-CM | POA: Diagnosis not present

## 2014-09-26 DIAGNOSIS — R262 Difficulty in walking, not elsewhere classified: Secondary | ICD-10-CM | POA: Diagnosis not present

## 2014-09-26 DIAGNOSIS — M6281 Muscle weakness (generalized): Secondary | ICD-10-CM | POA: Diagnosis not present

## 2014-09-26 DIAGNOSIS — R279 Unspecified lack of coordination: Secondary | ICD-10-CM | POA: Diagnosis not present

## 2014-09-30 DIAGNOSIS — R279 Unspecified lack of coordination: Secondary | ICD-10-CM | POA: Diagnosis not present

## 2014-09-30 DIAGNOSIS — R278 Other lack of coordination: Secondary | ICD-10-CM | POA: Diagnosis not present

## 2014-09-30 DIAGNOSIS — M6281 Muscle weakness (generalized): Secondary | ICD-10-CM | POA: Diagnosis not present

## 2014-09-30 DIAGNOSIS — R262 Difficulty in walking, not elsewhere classified: Secondary | ICD-10-CM | POA: Diagnosis not present

## 2014-10-01 DIAGNOSIS — R279 Unspecified lack of coordination: Secondary | ICD-10-CM | POA: Diagnosis not present

## 2014-10-01 DIAGNOSIS — M6281 Muscle weakness (generalized): Secondary | ICD-10-CM | POA: Diagnosis not present

## 2014-10-01 DIAGNOSIS — R262 Difficulty in walking, not elsewhere classified: Secondary | ICD-10-CM | POA: Diagnosis not present

## 2014-10-01 DIAGNOSIS — R278 Other lack of coordination: Secondary | ICD-10-CM | POA: Diagnosis not present

## 2014-10-06 DIAGNOSIS — M6281 Muscle weakness (generalized): Secondary | ICD-10-CM | POA: Diagnosis not present

## 2014-10-06 DIAGNOSIS — R278 Other lack of coordination: Secondary | ICD-10-CM | POA: Diagnosis not present

## 2014-10-06 DIAGNOSIS — R262 Difficulty in walking, not elsewhere classified: Secondary | ICD-10-CM | POA: Diagnosis not present

## 2014-10-06 DIAGNOSIS — R279 Unspecified lack of coordination: Secondary | ICD-10-CM | POA: Diagnosis not present

## 2014-10-07 DIAGNOSIS — M6281 Muscle weakness (generalized): Secondary | ICD-10-CM | POA: Diagnosis not present

## 2014-10-07 DIAGNOSIS — R278 Other lack of coordination: Secondary | ICD-10-CM | POA: Diagnosis not present

## 2014-10-07 DIAGNOSIS — R262 Difficulty in walking, not elsewhere classified: Secondary | ICD-10-CM | POA: Diagnosis not present

## 2014-10-07 DIAGNOSIS — R279 Unspecified lack of coordination: Secondary | ICD-10-CM | POA: Diagnosis not present

## 2014-10-08 DIAGNOSIS — R262 Difficulty in walking, not elsewhere classified: Secondary | ICD-10-CM | POA: Diagnosis not present

## 2014-10-08 DIAGNOSIS — M6281 Muscle weakness (generalized): Secondary | ICD-10-CM | POA: Diagnosis not present

## 2014-10-08 DIAGNOSIS — R279 Unspecified lack of coordination: Secondary | ICD-10-CM | POA: Diagnosis not present

## 2014-10-08 DIAGNOSIS — R278 Other lack of coordination: Secondary | ICD-10-CM | POA: Diagnosis not present

## 2014-10-14 ENCOUNTER — Emergency Department (HOSPITAL_COMMUNITY)
Admission: EM | Admit: 2014-10-14 | Discharge: 2014-10-14 | Disposition: A | Discharge status: Home / Self Care | Payer: Medicare Other | Attending: Emergency Medicine | Admitting: Emergency Medicine

## 2014-10-14 ENCOUNTER — Encounter (HOSPITAL_COMMUNITY): Payer: Self-pay | Admitting: Emergency Medicine

## 2014-10-14 ENCOUNTER — Emergency Department (HOSPITAL_COMMUNITY): Payer: Medicare Other

## 2014-10-14 DIAGNOSIS — I480 Paroxysmal atrial fibrillation: Secondary | ICD-10-CM | POA: Diagnosis not present

## 2014-10-14 DIAGNOSIS — Y998 Other external cause status: Secondary | ICD-10-CM | POA: Diagnosis not present

## 2014-10-14 DIAGNOSIS — Z8781 Personal history of (healed) traumatic fracture: Secondary | ICD-10-CM | POA: Diagnosis not present

## 2014-10-14 DIAGNOSIS — G309 Alzheimer's disease, unspecified: Secondary | ICD-10-CM | POA: Insufficient documentation

## 2014-10-14 DIAGNOSIS — I252 Old myocardial infarction: Secondary | ICD-10-CM | POA: Insufficient documentation

## 2014-10-14 DIAGNOSIS — S5001XA Contusion of right elbow, initial encounter: Secondary | ICD-10-CM | POA: Diagnosis not present

## 2014-10-14 DIAGNOSIS — Z79899 Other long term (current) drug therapy: Secondary | ICD-10-CM | POA: Insufficient documentation

## 2014-10-14 DIAGNOSIS — D68318 Other hemorrhagic disorder due to intrinsic circulating anticoagulants, antibodies, or inhibitors: Secondary | ICD-10-CM | POA: Diagnosis not present

## 2014-10-14 DIAGNOSIS — Y9389 Activity, other specified: Secondary | ICD-10-CM | POA: Diagnosis not present

## 2014-10-14 DIAGNOSIS — Z950 Presence of cardiac pacemaker: Secondary | ICD-10-CM | POA: Diagnosis not present

## 2014-10-14 DIAGNOSIS — W06XXXA Fall from bed, initial encounter: Secondary | ICD-10-CM | POA: Diagnosis not present

## 2014-10-14 DIAGNOSIS — F0280 Dementia in other diseases classified elsewhere without behavioral disturbance: Secondary | ICD-10-CM | POA: Insufficient documentation

## 2014-10-14 DIAGNOSIS — Y92092 Bedroom in other non-institutional residence as the place of occurrence of the external cause: Secondary | ICD-10-CM | POA: Insufficient documentation

## 2014-10-14 DIAGNOSIS — S4991XA Unspecified injury of right shoulder and upper arm, initial encounter: Secondary | ICD-10-CM | POA: Diagnosis present

## 2014-10-14 DIAGNOSIS — Z872 Personal history of diseases of the skin and subcutaneous tissue: Secondary | ICD-10-CM | POA: Diagnosis not present

## 2014-10-14 DIAGNOSIS — S40011A Contusion of right shoulder, initial encounter: Secondary | ICD-10-CM | POA: Diagnosis not present

## 2014-10-14 DIAGNOSIS — Z7901 Long term (current) use of anticoagulants: Secondary | ICD-10-CM | POA: Diagnosis not present

## 2014-10-14 DIAGNOSIS — W19XXXA Unspecified fall, initial encounter: Secondary | ICD-10-CM

## 2014-10-14 LAB — CBC WITH DIFFERENTIAL/PLATELET
Basophils Absolute: 0 10*3/uL (ref 0.0–0.1)
Basophils Relative: 0 % (ref 0–1)
EOS ABS: 0 10*3/uL (ref 0.0–0.7)
Eosinophils Relative: 0 % (ref 0–5)
HEMATOCRIT: 40.2 % (ref 36.0–46.0)
HEMOGLOBIN: 13.8 g/dL (ref 12.0–15.0)
LYMPHS ABS: 2.1 10*3/uL (ref 0.7–4.0)
Lymphocytes Relative: 12 % (ref 12–46)
MCH: 33.4 pg (ref 26.0–34.0)
MCHC: 34.3 g/dL (ref 30.0–36.0)
MCV: 97.3 fL (ref 78.0–100.0)
MONOS PCT: 13 % — AB (ref 3–12)
Monocytes Absolute: 2.2 10*3/uL — ABNORMAL HIGH (ref 0.1–1.0)
NEUTROS ABS: 12.9 10*3/uL — AB (ref 1.7–7.7)
Neutrophils Relative %: 75 % (ref 43–77)
Platelets: 188 10*3/uL (ref 150–400)
RBC: 4.13 MIL/uL (ref 3.87–5.11)
RDW: 14.1 % (ref 11.5–15.5)
WBC: 17.1 10*3/uL — ABNORMAL HIGH (ref 4.0–10.5)

## 2014-10-14 LAB — BASIC METABOLIC PANEL
Anion gap: 4 — ABNORMAL LOW (ref 5–15)
BUN: 17 mg/dL (ref 6–23)
CALCIUM: 9 mg/dL (ref 8.4–10.5)
CO2: 27 mmol/L (ref 19–32)
CREATININE: 0.57 mg/dL (ref 0.50–1.10)
Chloride: 99 mEq/L (ref 96–112)
GFR calc Af Amer: >90 mL/min (ref >90)
GFR calc non Af Amer: 81 mL/min — ABNORMAL LOW (ref >90)
GLUCOSE: 112 mg/dL — AB (ref 70–99)
Potassium: 3.8 mmol/L (ref 3.5–5.1)
Sodium: 130 mmol/L — ABNORMAL LOW (ref 135–145)

## 2014-10-14 LAB — PROTIME-INR
INR: 2.4 — ABNORMAL HIGH (ref 0.00–1.49)
Prothrombin Time: 26.3 seconds — ABNORMAL HIGH (ref 11.6–15.2)

## 2014-10-14 MED ORDER — HYDROCODONE-ACETAMINOPHEN 5-325 MG PO TABS: 1.0000 | ORAL_TABLET | Freq: Four times a day (QID) | ORAL | Status: AC | PRN

## 2014-10-14 NOTE — ED Notes (Signed)
Bed: WA20 Expected date:  Expected time:  Means of arrival:  Comments: EMS-fall 

## 2014-10-14 NOTE — ED Provider Notes (Signed)
CSN: [id number]     Arrival date & time 10/14/14  1059 History   First MD Initiated Contact with Patient 10/14/14 1145     Chief Complaint  Patient presents with  . Fall     (Consider location/radiation/quality/duration/timing/severity/associated sxs/prior Treatment) HPI Comments: Patient is an 81 year old female with history of dementia, atrial fibrillation, and ischemic cardiomyopathy. She presents with complaints of right shoulder and elbow pain. She states that she fell yesterday after getting her foot caught in her bed sheet. She is currently taking Coumadin for her atrial fibrillation, however denies having struck her head. She denies any headache or neck pain. Her niece who is her caretaker is at bedside and reports the patient is at her neurologic baseline.  Patient is a 81 y.o. female presenting with fall. The history is provided by the patient.  Fall This is a new problem. The current episode started yesterday. The problem occurs constantly. The problem has not changed since onset.Pertinent negatives include no chest pain, no abdominal pain, no headaches and no shortness of breath. Exacerbated by: Movement and palpation. She has tried nothing for the symptoms. The treatment provided no relief.    Past Medical History  Diagnosis Date  . PAF (paroxysmal atrial fibrillation)     a. chronic coumadin;  b. 10/2013 amio discontinued.  . Edema leg   . Alzheimer's disease   . Stasis dermatitis   . Myocardial infarction     a. 2008.  Marland Kitchen History of ischemic cardiomyopathy     a. 06/2008 s/p BSX N118 Cognis 100-D Bi-V ICD, ser # [id number];  b. 04/2013 Echo: EF 50-55%, no RWMA, Gr 2 DD, trace MR, mild to mod AI, mild Ao root dil (3.8cm @ Sinus of Valsalva);  c. 10/2013 ICD therapies turned off.  . Complete heart block     a. 06/2008 s/p CRT-D.  . Mild aortic insufficiency     a. 04/2013 Echo: Mild - Mod AI.  Marland Kitchen Right rib fracture     a. 05/2014 in setting of fall.   Past Surgical History   Procedure Laterality Date  . Knee arthroscopy      right  . Pacemaker insertion    . Total shoulder replacement     No family history on file. History  Substance Use Topics  . Smoking status: Never Smoker   . Smokeless tobacco: Not on file  . Alcohol Use: Not on file   OB History    No data available     Review of Systems  Respiratory: Negative for shortness of breath.   Cardiovascular: Negative for chest pain.  Gastrointestinal: Negative for abdominal pain.  Neurological: Negative for headaches.  All other systems reviewed and are negative.     Allergies  Sulfa antibiotics  Home Medications   Prior to Admission medications   Medication Sig Start Date End Date Taking? Authorizing Provider  metoprolol succinate (TOPROL-XL) 25 MG 24 hr tablet Take 25 mg by mouth daily.    Historical Provider, MD  traZODone (DESYREL) 50 MG tablet Take 50-100 mg by mouth at bedtime.    Historical Provider, MD  warfarin (COUMADIN) 4 MG tablet Take 4 mg by mouth as directed. 4 mg daily with the exception of Thursdays    Historical Provider, MD   BP 169/73 mmHg  Pulse 81  Temp(Src) 99 F (37.2 C) (Oral)  Resp 20  SpO2 93% Physical Exam  Constitutional: She is oriented to person, place, and time. She appears well-developed and well-nourished. No distress.  HENT:  Head: Normocephalic and atraumatic.  Neck: Normal range of motion. Neck supple.  Cardiovascular: Normal rate and regular rhythm.  Exam reveals no gallop and no friction rub.   No murmur heard. Pulmonary/Chest: Effort normal and breath sounds normal. No respiratory distress. She has no wheezes.  Abdominal: Soft. Bowel sounds are normal. She exhibits no distension. There is no tenderness.  Musculoskeletal: Normal range of motion.  There is a large ecchymosis over the anterior shoulder and proximal humerus. There is swelling and tenderness in this area as well. The elbow appears grossly normal. Distal ulnar and radial pulses are  palpable and sensation is intact to the entire hand. She is able to flex, extend, and oppose all fingers  Neurological: She is alert and oriented to person, place, and time.  Skin: Skin is warm and dry. She is not diaphoretic.  Nursing note and vitals reviewed.   ED Course  Procedures (including critical care time) Labs Review Labs Reviewed  CBC WITH DIFFERENTIAL  BASIC METABOLIC PANEL  PROTIME-INR    Imaging Review No results found.   EKG Interpretation None      MDM   Final diagnoses:  None    Patient is an 81 year old female who presents after a fall that occurred yesterday. She landed on her right shoulder and is having pain in her shoulder and elbow. Her x-rays reveal no obvious fracture. She has a large hematoma to the lateral aspect of the shoulder and I believe this is what is giving her the discomfort. She will be placed in an arm sling, treated with pain medication, and when necessary follow-up.    Veryl Speak, MD 10/14/14 [phone]

## 2014-10-14 NOTE — ED Notes (Signed)
Family at bedside, asking if RN can draw an INR level since she was supposed to go to PCP to have that drawn today. Pt is on coumadin.

## 2014-10-14 NOTE — ED Notes (Signed)
Ortho called 

## 2014-10-14 NOTE — ED Notes (Signed)
Patient transported to X-ray 

## 2014-10-14 NOTE — Discharge Instructions (Signed)
Wear arm sling as applied for the next week.  Hydrocodone as prescribed as needed for pain.  Follow-up with your primary Dr. in one week for a recheck, and return to the ER if you experience any new and concerning symptoms.   Contusion A contusion is a deep bruise. Contusions are the result of an injury that caused bleeding under the skin. The contusion may turn blue, purple, or yellow. Minor injuries will give you a painless contusion, but more severe contusions may stay painful and swollen for a few weeks.  CAUSES  A contusion is usually caused by a blow, trauma, or direct force to an area of the body. SYMPTOMS   Swelling and redness of the injured area.  Bruising of the injured area.  Tenderness and soreness of the injured area.  Pain. DIAGNOSIS  The diagnosis can be made by taking a history and physical exam. An X-ray, CT scan, or MRI may be needed to determine if there were any associated injuries, such as fractures. TREATMENT  Specific treatment will depend on what area of the body was injured. In general, the best treatment for a contusion is resting, icing, elevating, and applying cold compresses to the injured area. Over-the-counter medicines may also be recommended for pain control. Ask your caregiver what the best treatment is for your contusion. HOME CARE INSTRUCTIONS   Put ice on the injured area.  Put ice in a plastic bag.  Place a towel between your skin and the bag.  Leave the ice on for 15-20 minutes, 3-4 times a day, or as directed by your health care provider.  Only take over-the-counter or prescription medicines for pain, discomfort, or fever as directed by your caregiver. Your caregiver may recommend avoiding anti-inflammatory medicines (aspirin, ibuprofen, and naproxen) for 48 hours because these medicines may increase bruising.  Rest the injured area.  If possible, elevate the injured area to reduce swelling. SEEK IMMEDIATE MEDICAL CARE IF:   You have  increased bruising or swelling.  You have pain that is getting worse.  Your swelling or pain is not relieved with medicines. MAKE SURE YOU:   Understand these instructions.  Will watch your condition.  Will get help right away if you are not doing well or get worse. Document Released: 07/06/2005 Document Revised: 10/01/2013 Document Reviewed: 08/01/2011 [facility] Patient Information 2015 [place], [place]. This information is not intended to replace advice given to you by your health care provider. Make sure you discuss any questions you have with your health care provider.

## 2014-10-14 NOTE — Progress Notes (Signed)
  CARE MANAGEMENT ED NOTE 10/14/2014  Patient:  Rhonda Smith, Rhonda Smith   Account Number:  [id number]  Date Initiated:  10/14/2014  Documentation initiated by:  Jackelyn Poling  Subjective/Objective Assessment:   81 yr old medicare pt seen and treated and d/c back to facility by [facility] with Rx for HYDROcodone-acetaminophen (NORCO) 5-325 MG per tablet Take 1-2 tablets by mouth every 6 (six) hours as needed.     Subjective/Objective Assessment Detail:   pcp elizabeth barnes     Action/Plan:   CM received a call from Cleone Slim, med tech at facility requesting EDP to sign form to clarify narcotic order for [facility] to dispense for pt Dr Mingo Amber spoke with staff at [facility] to clarify narcotic pain med rx   Action/Plan Detail:   Anticipated DC Date:  10/14/2014     Status Recommendation to Physician:   Result of Recommendation:    Other ED Services  Consult [facility] [place]  Other  Outpatient Services - Pt will follow up  Medication Assistance    Choice offered to / List presented to:            Status of service:  Completed, signed off  ED Comments:   ED Comments Detail:

## 2014-10-14 NOTE — ED Notes (Signed)
Pt returned from xray

## 2014-10-14 NOTE — ED Notes (Addendum)
Per EMS: pt from [facility]. Pt had unwitnessed fall from bed 2 nights ago. Pt got leg caught on sheet, fell, got self up. Did not notify staff at time, pt has not been out of bed normally since, unable to move right arm. Bruising to shoulder to elbow. Pt is on coumadin. Pt denies hitting head during fall. Pt is ambulatory c/o neck stiffness.

## 2014-10-14 NOTE — ED Notes (Signed)
MD at bedside. 

## 2014-10-15 MED ORDER — HYDROCODONE-ACETAMINOPHEN 5-325 MG PO TABS: 1.0000 | ORAL_TABLET | ORAL | Status: AC | PRN

## 2014-10-15 MED ORDER — HYDROCODONE-ACETAMINOPHEN 5-325 MG PO TABS: 1.0000 | ORAL_TABLET | ORAL | Status: DC | PRN

## 2014-10-15 NOTE — ED Provider Notes (Addendum)
[facility] called. Previous script for norco not signed. Requesting one that is signed. Faxed to [phone].  Virgel Manifold, MD 10/15/14 1016  Apparently issue with facility, not prescriptions. Won't accept range of doses, ie "1-2 tablets." New script for 1 every four hours again faxed.   Virgel Manifold, MD 10/15/14 1325

## 2014-10-15 NOTE — Progress Notes (Signed)
ED Cm received a call from [facility] after being transferred by ED unit secretary and flow manager about further need for clarification of pt vicodin rx from 10/14/14 Cm spoke with sharon, Dr Wilson Singer and Santiago Glad, Financial controller. New Rx issued for vicodin 1 po q 4 hours prn for moderate pain or severe pain. CM called Ivin Booty at [phone] to review new rx. Ivin Booty confirmed new Rx is what is needed to resolve issue Cm faxed Rx to [phone] per sharon Request and she states she will fax to [facility]. Cm also faxed to previous number for [facility] left with Santiago Glad, Financial controller as [phone] with fax confirmations received at 1335 & 1341

## 2014-10-20 DIAGNOSIS — Z7901 Long term (current) use of anticoagulants: Secondary | ICD-10-CM | POA: Diagnosis not present

## 2014-10-20 DIAGNOSIS — R350 Frequency of micturition: Secondary | ICD-10-CM | POA: Diagnosis not present

## 2014-10-20 DIAGNOSIS — S40011A Contusion of right shoulder, initial encounter: Secondary | ICD-10-CM | POA: Diagnosis not present

## 2014-10-20 DIAGNOSIS — I4891 Unspecified atrial fibrillation: Secondary | ICD-10-CM | POA: Diagnosis not present

## 2014-10-29 DIAGNOSIS — I4891 Unspecified atrial fibrillation: Secondary | ICD-10-CM | POA: Diagnosis not present

## 2014-10-29 DIAGNOSIS — R5383 Other fatigue: Secondary | ICD-10-CM | POA: Diagnosis not present

## 2014-10-29 DIAGNOSIS — Z5181 Encounter for therapeutic drug level monitoring: Secondary | ICD-10-CM | POA: Diagnosis not present

## 2014-10-29 DIAGNOSIS — Z7901 Long term (current) use of anticoagulants: Secondary | ICD-10-CM | POA: Diagnosis not present

## 2014-10-29 DIAGNOSIS — S40011A Contusion of right shoulder, initial encounter: Secondary | ICD-10-CM | POA: Diagnosis not present

## 2014-10-29 DIAGNOSIS — M129 Arthropathy, unspecified: Secondary | ICD-10-CM | POA: Diagnosis not present

## 2015-01-09 DIAGNOSIS — M6281 Muscle weakness (generalized): Secondary | ICD-10-CM | POA: Diagnosis not present

## 2015-01-09 DIAGNOSIS — R278 Other lack of coordination: Secondary | ICD-10-CM | POA: Diagnosis not present

## 2015-01-13 DIAGNOSIS — M6281 Muscle weakness (generalized): Secondary | ICD-10-CM | POA: Diagnosis not present

## 2015-01-13 DIAGNOSIS — R278 Other lack of coordination: Secondary | ICD-10-CM | POA: Diagnosis not present

## 2015-01-15 DIAGNOSIS — M6281 Muscle weakness (generalized): Secondary | ICD-10-CM | POA: Diagnosis not present

## 2015-01-15 DIAGNOSIS — R278 Other lack of coordination: Secondary | ICD-10-CM | POA: Diagnosis not present

## 2015-01-16 DIAGNOSIS — M6281 Muscle weakness (generalized): Secondary | ICD-10-CM | POA: Diagnosis not present

## 2015-01-16 DIAGNOSIS — R278 Other lack of coordination: Secondary | ICD-10-CM | POA: Diagnosis not present

## 2015-10-10 ENCOUNTER — Other Ambulatory Visit
Admission: RE | Admit: 2015-10-10 | Discharge: 2015-10-10 | Disposition: A | Discharge status: Home / Self Care | Payer: Medicare Other | Source: Ambulatory Visit | Attending: Family Medicine | Admitting: Family Medicine

## 2015-10-10 DIAGNOSIS — I4891 Unspecified atrial fibrillation: Secondary | ICD-10-CM | POA: Diagnosis not present

## 2015-10-10 DIAGNOSIS — N390 Urinary tract infection, site not specified: Secondary | ICD-10-CM | POA: Insufficient documentation

## 2015-10-10 DIAGNOSIS — M6281 Muscle weakness (generalized): Secondary | ICD-10-CM | POA: Diagnosis not present

## 2015-10-10 DIAGNOSIS — Z5181 Encounter for therapeutic drug level monitoring: Secondary | ICD-10-CM | POA: Diagnosis not present

## 2015-10-10 DIAGNOSIS — Z950 Presence of cardiac pacemaker: Secondary | ICD-10-CM | POA: Diagnosis not present

## 2015-10-10 DIAGNOSIS — I482 Chronic atrial fibrillation: Secondary | ICD-10-CM | POA: Insufficient documentation

## 2015-10-10 DIAGNOSIS — Z7901 Long term (current) use of anticoagulants: Secondary | ICD-10-CM | POA: Diagnosis not present

## 2015-10-10 DIAGNOSIS — R2689 Other abnormalities of gait and mobility: Secondary | ICD-10-CM | POA: Diagnosis not present

## 2015-10-10 DIAGNOSIS — Z87440 Personal history of urinary (tract) infections: Secondary | ICD-10-CM | POA: Diagnosis not present

## 2015-10-10 DIAGNOSIS — I252 Old myocardial infarction: Secondary | ICD-10-CM | POA: Diagnosis not present

## 2015-10-10 LAB — URINALYSIS COMPLETE WITH MICROSCOPIC (ARMC ONLY)
BILIRUBIN URINE: NEGATIVE
GLUCOSE, UA: NEGATIVE mg/dL
HGB URINE DIPSTICK: NEGATIVE
KETONES UR: NEGATIVE mg/dL
NITRITE: NEGATIVE
PH: 7 (ref 5.0–8.0)
Protein, ur: NEGATIVE mg/dL
Specific Gravity, Urine: 1.01 (ref 1.005–1.030)

## 2015-10-10 LAB — PROTIME-INR
INR: 2.4
PROTHROMBIN TIME: 25.9 s — AB (ref 11.4–15.0)

## 2015-10-13 DIAGNOSIS — Z5181 Encounter for therapeutic drug level monitoring: Secondary | ICD-10-CM | POA: Diagnosis not present

## 2015-10-13 DIAGNOSIS — M6281 Muscle weakness (generalized): Secondary | ICD-10-CM | POA: Diagnosis not present

## 2015-10-13 DIAGNOSIS — R2689 Other abnormalities of gait and mobility: Secondary | ICD-10-CM | POA: Diagnosis not present

## 2015-10-13 DIAGNOSIS — I4891 Unspecified atrial fibrillation: Secondary | ICD-10-CM | POA: Diagnosis not present

## 2015-10-13 DIAGNOSIS — I252 Old myocardial infarction: Secondary | ICD-10-CM | POA: Diagnosis not present

## 2015-10-13 DIAGNOSIS — Z950 Presence of cardiac pacemaker: Secondary | ICD-10-CM | POA: Diagnosis not present

## 2015-10-14 DIAGNOSIS — Z950 Presence of cardiac pacemaker: Secondary | ICD-10-CM | POA: Diagnosis not present

## 2015-10-14 DIAGNOSIS — G4700 Insomnia, unspecified: Secondary | ICD-10-CM | POA: Diagnosis not present

## 2015-10-14 DIAGNOSIS — R2689 Other abnormalities of gait and mobility: Secondary | ICD-10-CM | POA: Diagnosis not present

## 2015-10-14 DIAGNOSIS — I252 Old myocardial infarction: Secondary | ICD-10-CM | POA: Diagnosis not present

## 2015-10-14 DIAGNOSIS — I10 Essential (primary) hypertension: Secondary | ICD-10-CM | POA: Diagnosis not present

## 2015-10-14 DIAGNOSIS — Z7901 Long term (current) use of anticoagulants: Secondary | ICD-10-CM | POA: Diagnosis not present

## 2015-10-14 DIAGNOSIS — Z5181 Encounter for therapeutic drug level monitoring: Secondary | ICD-10-CM | POA: Diagnosis not present

## 2015-10-14 DIAGNOSIS — M6281 Muscle weakness (generalized): Secondary | ICD-10-CM | POA: Diagnosis not present

## 2015-10-14 DIAGNOSIS — R609 Edema, unspecified: Secondary | ICD-10-CM | POA: Diagnosis not present

## 2015-10-14 DIAGNOSIS — I4891 Unspecified atrial fibrillation: Secondary | ICD-10-CM | POA: Diagnosis not present

## 2015-10-15 DIAGNOSIS — R2689 Other abnormalities of gait and mobility: Secondary | ICD-10-CM | POA: Diagnosis not present

## 2015-10-15 DIAGNOSIS — Z5181 Encounter for therapeutic drug level monitoring: Secondary | ICD-10-CM | POA: Diagnosis not present

## 2015-10-15 DIAGNOSIS — M6281 Muscle weakness (generalized): Secondary | ICD-10-CM | POA: Diagnosis not present

## 2015-10-15 DIAGNOSIS — I252 Old myocardial infarction: Secondary | ICD-10-CM | POA: Diagnosis not present

## 2015-10-15 DIAGNOSIS — Z950 Presence of cardiac pacemaker: Secondary | ICD-10-CM | POA: Diagnosis not present

## 2015-10-15 DIAGNOSIS — I4891 Unspecified atrial fibrillation: Secondary | ICD-10-CM | POA: Diagnosis not present

## 2015-10-19 DIAGNOSIS — Z5181 Encounter for therapeutic drug level monitoring: Secondary | ICD-10-CM | POA: Diagnosis not present

## 2015-10-19 DIAGNOSIS — M6281 Muscle weakness (generalized): Secondary | ICD-10-CM | POA: Diagnosis not present

## 2015-10-19 DIAGNOSIS — R2689 Other abnormalities of gait and mobility: Secondary | ICD-10-CM | POA: Diagnosis not present

## 2015-10-19 DIAGNOSIS — I4891 Unspecified atrial fibrillation: Secondary | ICD-10-CM | POA: Diagnosis not present

## 2015-10-19 DIAGNOSIS — Z950 Presence of cardiac pacemaker: Secondary | ICD-10-CM | POA: Diagnosis not present

## 2015-10-19 DIAGNOSIS — I252 Old myocardial infarction: Secondary | ICD-10-CM | POA: Diagnosis not present

## 2015-10-21 DIAGNOSIS — I252 Old myocardial infarction: Secondary | ICD-10-CM | POA: Diagnosis not present

## 2015-10-21 DIAGNOSIS — M6281 Muscle weakness (generalized): Secondary | ICD-10-CM | POA: Diagnosis not present

## 2015-10-21 DIAGNOSIS — Z5181 Encounter for therapeutic drug level monitoring: Secondary | ICD-10-CM | POA: Diagnosis not present

## 2015-10-21 DIAGNOSIS — I4891 Unspecified atrial fibrillation: Secondary | ICD-10-CM | POA: Diagnosis not present

## 2015-10-21 DIAGNOSIS — Z950 Presence of cardiac pacemaker: Secondary | ICD-10-CM | POA: Diagnosis not present

## 2015-10-21 DIAGNOSIS — R2689 Other abnormalities of gait and mobility: Secondary | ICD-10-CM | POA: Diagnosis not present

## 2015-10-30 DIAGNOSIS — M6281 Muscle weakness (generalized): Secondary | ICD-10-CM | POA: Diagnosis not present

## 2015-10-30 DIAGNOSIS — I4891 Unspecified atrial fibrillation: Secondary | ICD-10-CM | POA: Diagnosis not present

## 2015-10-30 DIAGNOSIS — Z950 Presence of cardiac pacemaker: Secondary | ICD-10-CM | POA: Diagnosis not present

## 2015-10-30 DIAGNOSIS — I252 Old myocardial infarction: Secondary | ICD-10-CM | POA: Diagnosis not present

## 2015-10-30 DIAGNOSIS — R2689 Other abnormalities of gait and mobility: Secondary | ICD-10-CM | POA: Diagnosis not present

## 2015-10-30 DIAGNOSIS — Z5181 Encounter for therapeutic drug level monitoring: Secondary | ICD-10-CM | POA: Diagnosis not present

## 2015-11-09 DIAGNOSIS — I4891 Unspecified atrial fibrillation: Secondary | ICD-10-CM | POA: Diagnosis not present

## 2015-11-09 DIAGNOSIS — M6281 Muscle weakness (generalized): Secondary | ICD-10-CM | POA: Diagnosis not present

## 2015-11-09 DIAGNOSIS — I252 Old myocardial infarction: Secondary | ICD-10-CM | POA: Diagnosis not present

## 2015-11-09 DIAGNOSIS — Z5181 Encounter for therapeutic drug level monitoring: Secondary | ICD-10-CM | POA: Diagnosis not present

## 2015-11-09 DIAGNOSIS — Z950 Presence of cardiac pacemaker: Secondary | ICD-10-CM | POA: Diagnosis not present

## 2015-11-09 DIAGNOSIS — R2689 Other abnormalities of gait and mobility: Secondary | ICD-10-CM | POA: Diagnosis not present

## 2015-11-10 DIAGNOSIS — I10 Essential (primary) hypertension: Secondary | ICD-10-CM | POA: Diagnosis not present

## 2015-11-10 DIAGNOSIS — Z7901 Long term (current) use of anticoagulants: Secondary | ICD-10-CM | POA: Diagnosis not present

## 2015-11-10 DIAGNOSIS — G4700 Insomnia, unspecified: Secondary | ICD-10-CM | POA: Diagnosis not present

## 2015-11-11 DIAGNOSIS — I252 Old myocardial infarction: Secondary | ICD-10-CM | POA: Diagnosis not present

## 2015-11-11 DIAGNOSIS — I4891 Unspecified atrial fibrillation: Secondary | ICD-10-CM | POA: Diagnosis not present

## 2015-11-11 DIAGNOSIS — G4700 Insomnia, unspecified: Secondary | ICD-10-CM | POA: Diagnosis not present

## 2015-11-11 DIAGNOSIS — M6281 Muscle weakness (generalized): Secondary | ICD-10-CM | POA: Diagnosis not present

## 2015-11-11 DIAGNOSIS — R2689 Other abnormalities of gait and mobility: Secondary | ICD-10-CM | POA: Diagnosis not present

## 2015-11-11 DIAGNOSIS — Z950 Presence of cardiac pacemaker: Secondary | ICD-10-CM | POA: Diagnosis not present

## 2015-11-11 DIAGNOSIS — Z5181 Encounter for therapeutic drug level monitoring: Secondary | ICD-10-CM | POA: Diagnosis not present

## 2015-11-18 DIAGNOSIS — I4891 Unspecified atrial fibrillation: Secondary | ICD-10-CM | POA: Diagnosis not present

## 2015-11-18 DIAGNOSIS — I10 Essential (primary) hypertension: Secondary | ICD-10-CM | POA: Diagnosis not present

## 2015-11-18 DIAGNOSIS — G4700 Insomnia, unspecified: Secondary | ICD-10-CM | POA: Diagnosis not present

## 2015-11-18 DIAGNOSIS — Z950 Presence of cardiac pacemaker: Secondary | ICD-10-CM | POA: Diagnosis not present

## 2015-11-18 DIAGNOSIS — R609 Edema, unspecified: Secondary | ICD-10-CM | POA: Diagnosis not present

## 2015-11-18 DIAGNOSIS — Z7901 Long term (current) use of anticoagulants: Secondary | ICD-10-CM | POA: Diagnosis not present

## 2015-11-18 DIAGNOSIS — H01004 Unspecified blepharitis left upper eyelid: Secondary | ICD-10-CM | POA: Diagnosis not present

## 2015-11-18 DIAGNOSIS — I252 Old myocardial infarction: Secondary | ICD-10-CM | POA: Diagnosis not present

## 2015-11-30 DIAGNOSIS — I252 Old myocardial infarction: Secondary | ICD-10-CM | POA: Diagnosis not present

## 2015-11-30 DIAGNOSIS — I4891 Unspecified atrial fibrillation: Secondary | ICD-10-CM | POA: Diagnosis not present

## 2015-11-30 DIAGNOSIS — R2689 Other abnormalities of gait and mobility: Secondary | ICD-10-CM | POA: Diagnosis not present

## 2015-11-30 DIAGNOSIS — Z5181 Encounter for therapeutic drug level monitoring: Secondary | ICD-10-CM | POA: Diagnosis not present

## 2015-11-30 DIAGNOSIS — Z950 Presence of cardiac pacemaker: Secondary | ICD-10-CM | POA: Diagnosis not present

## 2015-11-30 DIAGNOSIS — M6281 Muscle weakness (generalized): Secondary | ICD-10-CM | POA: Diagnosis not present

## 2015-12-09 DIAGNOSIS — G4700 Insomnia, unspecified: Secondary | ICD-10-CM | POA: Diagnosis not present

## 2015-12-16 DIAGNOSIS — Z950 Presence of cardiac pacemaker: Secondary | ICD-10-CM | POA: Diagnosis not present

## 2015-12-16 DIAGNOSIS — G4700 Insomnia, unspecified: Secondary | ICD-10-CM | POA: Diagnosis not present

## 2015-12-16 DIAGNOSIS — I252 Old myocardial infarction: Secondary | ICD-10-CM | POA: Diagnosis not present

## 2015-12-16 DIAGNOSIS — Z7901 Long term (current) use of anticoagulants: Secondary | ICD-10-CM | POA: Diagnosis not present

## 2015-12-16 DIAGNOSIS — I4891 Unspecified atrial fibrillation: Secondary | ICD-10-CM | POA: Diagnosis not present

## 2015-12-16 DIAGNOSIS — I10 Essential (primary) hypertension: Secondary | ICD-10-CM | POA: Diagnosis not present

## 2015-12-16 DIAGNOSIS — R609 Edema, unspecified: Secondary | ICD-10-CM | POA: Diagnosis not present

## 2016-01-08 DIAGNOSIS — Z7901 Long term (current) use of anticoagulants: Secondary | ICD-10-CM | POA: Diagnosis not present

## 2016-01-08 DIAGNOSIS — R609 Edema, unspecified: Secondary | ICD-10-CM | POA: Diagnosis not present

## 2016-01-08 DIAGNOSIS — I4891 Unspecified atrial fibrillation: Secondary | ICD-10-CM | POA: Diagnosis not present

## 2016-01-08 DIAGNOSIS — G4700 Insomnia, unspecified: Secondary | ICD-10-CM | POA: Diagnosis not present

## 2016-01-08 DIAGNOSIS — I252 Old myocardial infarction: Secondary | ICD-10-CM | POA: Diagnosis not present

## 2016-01-13 DIAGNOSIS — R609 Edema, unspecified: Secondary | ICD-10-CM | POA: Diagnosis not present

## 2016-01-13 DIAGNOSIS — I252 Old myocardial infarction: Secondary | ICD-10-CM | POA: Diagnosis not present

## 2016-01-13 DIAGNOSIS — Z7901 Long term (current) use of anticoagulants: Secondary | ICD-10-CM | POA: Diagnosis not present

## 2016-01-13 DIAGNOSIS — Z950 Presence of cardiac pacemaker: Secondary | ICD-10-CM | POA: Diagnosis not present

## 2016-01-13 DIAGNOSIS — G4700 Insomnia, unspecified: Secondary | ICD-10-CM | POA: Diagnosis not present

## 2016-01-13 DIAGNOSIS — I10 Essential (primary) hypertension: Secondary | ICD-10-CM | POA: Diagnosis not present

## 2016-01-13 DIAGNOSIS — I4891 Unspecified atrial fibrillation: Secondary | ICD-10-CM | POA: Diagnosis not present

## 2016-02-05 DIAGNOSIS — R609 Edema, unspecified: Secondary | ICD-10-CM | POA: Diagnosis not present

## 2016-02-05 DIAGNOSIS — Z7901 Long term (current) use of anticoagulants: Secondary | ICD-10-CM | POA: Diagnosis not present

## 2016-02-05 DIAGNOSIS — I4891 Unspecified atrial fibrillation: Secondary | ICD-10-CM | POA: Diagnosis not present

## 2016-02-05 DIAGNOSIS — I252 Old myocardial infarction: Secondary | ICD-10-CM | POA: Diagnosis not present

## 2016-02-05 DIAGNOSIS — G4700 Insomnia, unspecified: Secondary | ICD-10-CM | POA: Diagnosis not present

## 2016-02-10 DIAGNOSIS — I10 Essential (primary) hypertension: Secondary | ICD-10-CM | POA: Diagnosis not present

## 2016-02-10 DIAGNOSIS — I4891 Unspecified atrial fibrillation: Secondary | ICD-10-CM | POA: Diagnosis not present

## 2016-02-10 DIAGNOSIS — G4700 Insomnia, unspecified: Secondary | ICD-10-CM | POA: Diagnosis not present

## 2016-02-10 DIAGNOSIS — Z950 Presence of cardiac pacemaker: Secondary | ICD-10-CM | POA: Diagnosis not present

## 2016-02-10 DIAGNOSIS — Z7901 Long term (current) use of anticoagulants: Secondary | ICD-10-CM | POA: Diagnosis not present

## 2016-02-10 DIAGNOSIS — R609 Edema, unspecified: Secondary | ICD-10-CM | POA: Diagnosis not present

## 2016-02-10 DIAGNOSIS — I252 Old myocardial infarction: Secondary | ICD-10-CM | POA: Diagnosis not present

## 2016-02-20 IMAGING — CT CT CERVICAL SPINE W/O CM
2 of 4 series · 6 of 14 positions shown, 7 images · non-contrast
Comparison: None available for comparison at time of study
interpretation.

CLINICAL DATA: Fall, on Coumadin.

EXAM:
CT HEAD WITHOUT CONTRAST
CT CERVICAL SPINE WITHOUT CONTRAST
TECHNIQUE: Multidetector CT imaging of the head and cervical spine was
performed following the standard protocol without intravenous
contrast. Multiplanar CT image reconstructions of the cervical spine
were also generated.

[Series 5: c-spine st · axial · 0.25mm/px · z∈[-226,-142]mm · 3 of 84 slices shown]
[im 21/84  bone]
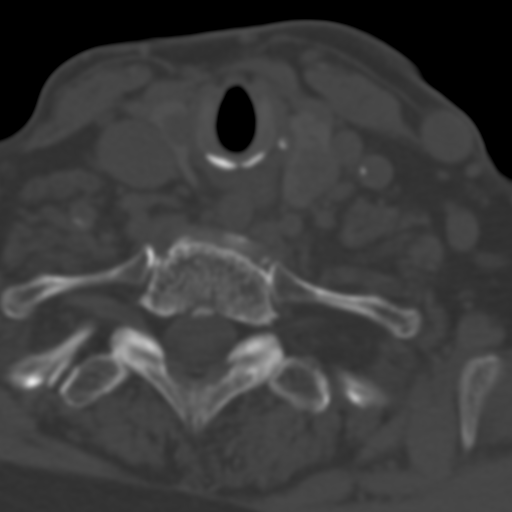
[im 42/84  bone]
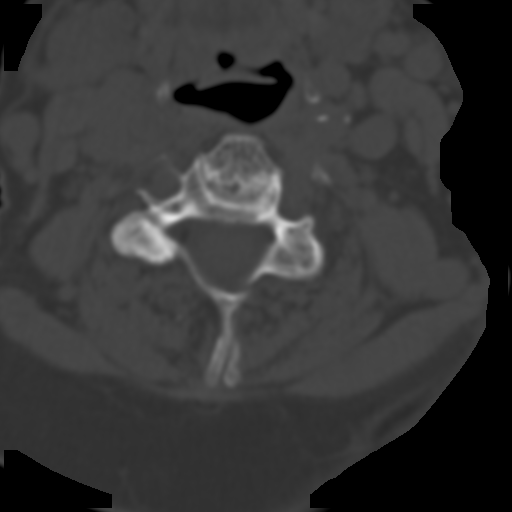
[im 63/84  bone]
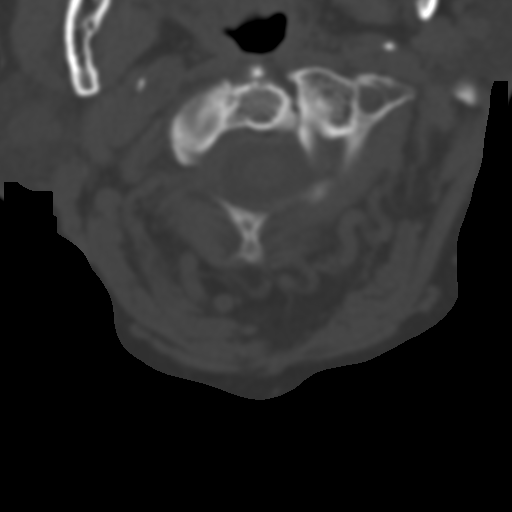

[Series 7: axial recon · axial · 0.20mm/px · z∈[-241,-167]mm · 3 of 85 slices shown, 4 images]
[im 22/85  soft-tissue]
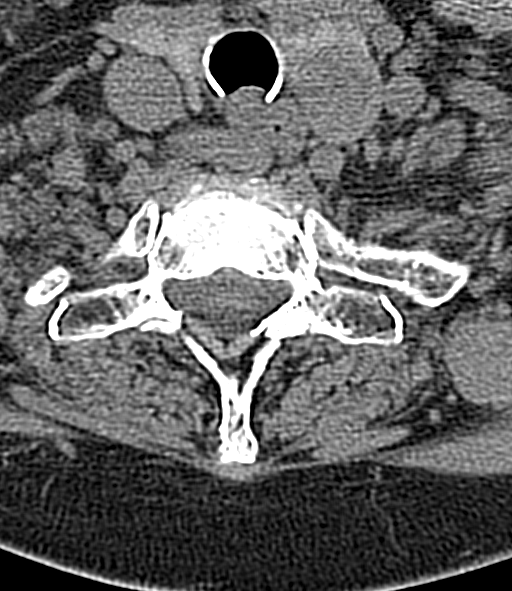
[im 22/85  bone]
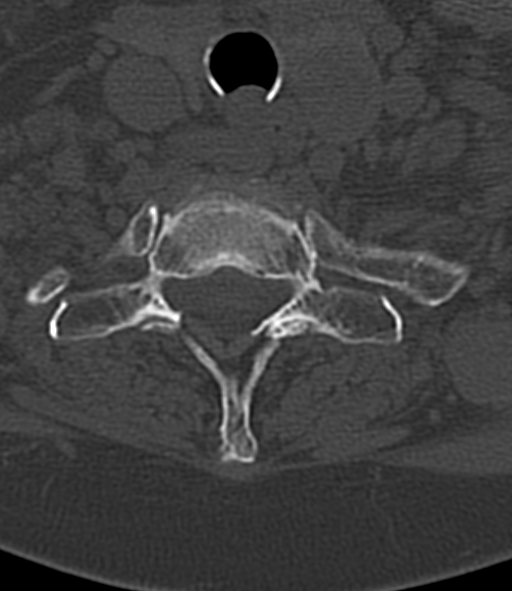
[im 43/85  bone]
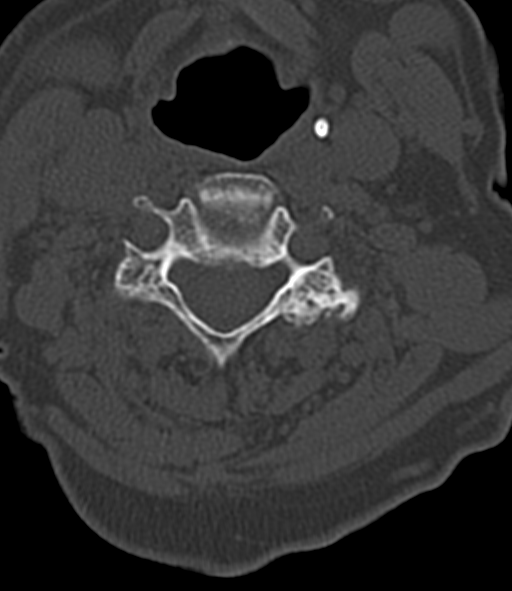
[im 64/85  bone]
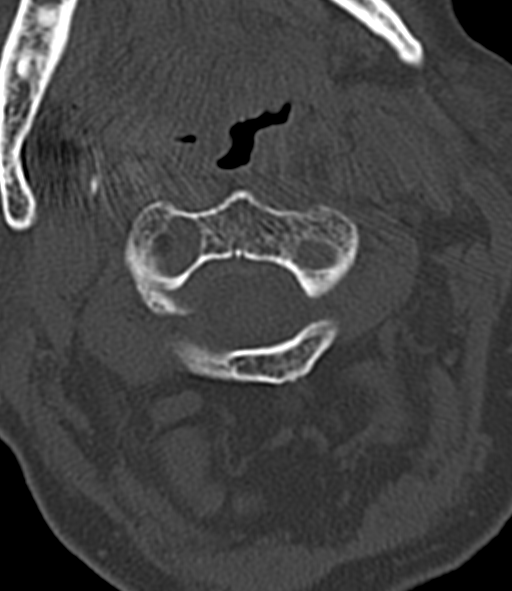

[6 of 14 positions shown; findings below may reference images not displayed]

FINDINGS: CT HEAD FINDINGS

The ventricles and sulci are normal for age. No intraparenchymal
hemorrhage, mass effect nor midline shift. Patchy supratentorial
white matter hypodensities are within normal range for patient's age
and though non-specific suggest sequelae of chronic small vessel
ischemic disease. No acute large vascular territory infarcts. Small
chronic appearing left basal ganglia lacunar infarcts.

No abnormal extra-axial fluid collections. Basal cisterns are
patent. Moderate calcific atherosclerosis of the carotid siphons.

No skull fracture. Osteopenia. The included ocular globes and
orbital contents are non-suspicious. Status post bilateral ocular
lens implants. Atretic left maxillary sinus with bony wall
thickening and air-fluid level consistent with acute on chronic
sinusitis. Visualized mastoid air cells are well aerated.

CT CERVICAL SPINE FINDINGS

Cervical vertebral bodies and posterior elements are intact. Minimal
grade 1 C4-5 anterolisthesis on spondylotic basis. Severe C5-6, C6-7
a moderate to severe C4-5 disc degeneration, moderate upper cervical
facet arthropathy. Bone mineral density decreased without
destructive bony lesions. Moderate C1-2 osteoarthrosis.

16 x 24 mm dominant nodule left thyroid gland with coarse
calcification. Sub cm right thyroid nodule. Cardiac pacemaker leads
in place.
IMPRESSION: CT head:  No acute intracranial process.

Involutional changes. Moderate white matter changes suggest chronic
small vessel ischemic disease, chronic appearing left basal ganglia
lacunar infarct.

CT cervical spine: No acute fracture. Minimal C4-5 anterolisthesis
on degenerative basis.

24 mm dominant left thyroid nodule for which follow-up sonogram is
recommended on a nonemergent basis, as clinically indicated.

  By: Ravi Kiran Lourembam

## 2016-03-07 DIAGNOSIS — G4700 Insomnia, unspecified: Secondary | ICD-10-CM | POA: Diagnosis not present

## 2016-03-07 DIAGNOSIS — I252 Old myocardial infarction: Secondary | ICD-10-CM | POA: Diagnosis not present

## 2016-03-07 DIAGNOSIS — R609 Edema, unspecified: Secondary | ICD-10-CM | POA: Diagnosis not present

## 2016-03-07 DIAGNOSIS — Z7901 Long term (current) use of anticoagulants: Secondary | ICD-10-CM | POA: Diagnosis not present

## 2016-03-07 DIAGNOSIS — I4891 Unspecified atrial fibrillation: Secondary | ICD-10-CM | POA: Diagnosis not present

## 2016-03-09 DIAGNOSIS — G4700 Insomnia, unspecified: Secondary | ICD-10-CM | POA: Diagnosis not present

## 2016-03-09 DIAGNOSIS — R609 Edema, unspecified: Secondary | ICD-10-CM | POA: Diagnosis not present

## 2016-03-09 DIAGNOSIS — Z7901 Long term (current) use of anticoagulants: Secondary | ICD-10-CM | POA: Diagnosis not present

## 2016-03-09 DIAGNOSIS — I4891 Unspecified atrial fibrillation: Secondary | ICD-10-CM | POA: Diagnosis not present

## 2016-03-09 DIAGNOSIS — Z950 Presence of cardiac pacemaker: Secondary | ICD-10-CM | POA: Diagnosis not present

## 2016-03-09 DIAGNOSIS — I252 Old myocardial infarction: Secondary | ICD-10-CM | POA: Diagnosis not present

## 2016-03-09 DIAGNOSIS — I10 Essential (primary) hypertension: Secondary | ICD-10-CM | POA: Diagnosis not present

## 2016-04-05 DIAGNOSIS — G4700 Insomnia, unspecified: Secondary | ICD-10-CM | POA: Diagnosis not present

## 2016-04-06 DIAGNOSIS — R609 Edema, unspecified: Secondary | ICD-10-CM | POA: Diagnosis not present

## 2016-04-06 DIAGNOSIS — G4700 Insomnia, unspecified: Secondary | ICD-10-CM | POA: Diagnosis not present

## 2016-04-06 DIAGNOSIS — Z7901 Long term (current) use of anticoagulants: Secondary | ICD-10-CM | POA: Diagnosis not present

## 2016-04-06 DIAGNOSIS — I10 Essential (primary) hypertension: Secondary | ICD-10-CM | POA: Diagnosis not present

## 2016-04-06 DIAGNOSIS — I4891 Unspecified atrial fibrillation: Secondary | ICD-10-CM | POA: Diagnosis not present

## 2016-04-06 DIAGNOSIS — Z950 Presence of cardiac pacemaker: Secondary | ICD-10-CM | POA: Diagnosis not present

## 2016-04-06 DIAGNOSIS — I252 Old myocardial infarction: Secondary | ICD-10-CM | POA: Diagnosis not present

## 2016-05-03 DIAGNOSIS — G4700 Insomnia, unspecified: Secondary | ICD-10-CM | POA: Diagnosis not present

## 2016-05-04 DIAGNOSIS — I4891 Unspecified atrial fibrillation: Secondary | ICD-10-CM | POA: Diagnosis not present

## 2016-05-04 DIAGNOSIS — G4700 Insomnia, unspecified: Secondary | ICD-10-CM | POA: Diagnosis not present

## 2016-05-04 DIAGNOSIS — Z950 Presence of cardiac pacemaker: Secondary | ICD-10-CM | POA: Diagnosis not present

## 2016-05-04 DIAGNOSIS — I252 Old myocardial infarction: Secondary | ICD-10-CM | POA: Diagnosis not present

## 2016-05-04 DIAGNOSIS — Z7901 Long term (current) use of anticoagulants: Secondary | ICD-10-CM | POA: Diagnosis not present

## 2016-05-04 DIAGNOSIS — I10 Essential (primary) hypertension: Secondary | ICD-10-CM | POA: Diagnosis not present

## 2016-05-04 DIAGNOSIS — R609 Edema, unspecified: Secondary | ICD-10-CM | POA: Diagnosis not present

## 2016-05-09 DIAGNOSIS — I252 Old myocardial infarction: Secondary | ICD-10-CM | POA: Diagnosis not present

## 2016-05-09 DIAGNOSIS — R609 Edema, unspecified: Secondary | ICD-10-CM | POA: Diagnosis not present

## 2016-05-09 DIAGNOSIS — I4891 Unspecified atrial fibrillation: Secondary | ICD-10-CM | POA: Diagnosis not present

## 2016-05-09 DIAGNOSIS — Z7901 Long term (current) use of anticoagulants: Secondary | ICD-10-CM | POA: Diagnosis not present

## 2016-05-09 DIAGNOSIS — G4700 Insomnia, unspecified: Secondary | ICD-10-CM | POA: Diagnosis not present

## 2016-06-01 DIAGNOSIS — R609 Edema, unspecified: Secondary | ICD-10-CM | POA: Diagnosis not present

## 2016-06-01 DIAGNOSIS — I4891 Unspecified atrial fibrillation: Secondary | ICD-10-CM | POA: Diagnosis not present

## 2016-06-01 DIAGNOSIS — Z7901 Long term (current) use of anticoagulants: Secondary | ICD-10-CM | POA: Diagnosis not present

## 2016-06-01 DIAGNOSIS — Z950 Presence of cardiac pacemaker: Secondary | ICD-10-CM | POA: Diagnosis not present

## 2016-06-01 DIAGNOSIS — G4700 Insomnia, unspecified: Secondary | ICD-10-CM | POA: Diagnosis not present

## 2016-06-01 DIAGNOSIS — I10 Essential (primary) hypertension: Secondary | ICD-10-CM | POA: Diagnosis not present

## 2016-06-01 DIAGNOSIS — I252 Old myocardial infarction: Secondary | ICD-10-CM | POA: Diagnosis not present

## 2016-06-07 DIAGNOSIS — G4700 Insomnia, unspecified: Secondary | ICD-10-CM | POA: Diagnosis not present

## 2016-06-29 DIAGNOSIS — I252 Old myocardial infarction: Secondary | ICD-10-CM | POA: Diagnosis not present

## 2016-06-29 DIAGNOSIS — I10 Essential (primary) hypertension: Secondary | ICD-10-CM | POA: Diagnosis not present

## 2016-06-29 DIAGNOSIS — Z7901 Long term (current) use of anticoagulants: Secondary | ICD-10-CM | POA: Diagnosis not present

## 2016-06-29 DIAGNOSIS — Z950 Presence of cardiac pacemaker: Secondary | ICD-10-CM | POA: Diagnosis not present

## 2016-06-29 DIAGNOSIS — I4891 Unspecified atrial fibrillation: Secondary | ICD-10-CM | POA: Diagnosis not present

## 2016-06-29 DIAGNOSIS — R609 Edema, unspecified: Secondary | ICD-10-CM | POA: Diagnosis not present

## 2016-07-05 DIAGNOSIS — G4700 Insomnia, unspecified: Secondary | ICD-10-CM | POA: Diagnosis not present

## 2016-07-07 DIAGNOSIS — G4700 Insomnia, unspecified: Secondary | ICD-10-CM | POA: Diagnosis not present

## 2016-07-07 DIAGNOSIS — I4891 Unspecified atrial fibrillation: Secondary | ICD-10-CM | POA: Diagnosis not present

## 2016-07-07 DIAGNOSIS — R609 Edema, unspecified: Secondary | ICD-10-CM | POA: Diagnosis not present

## 2016-07-07 DIAGNOSIS — Z7901 Long term (current) use of anticoagulants: Secondary | ICD-10-CM | POA: Diagnosis not present

## 2016-07-07 DIAGNOSIS — I252 Old myocardial infarction: Secondary | ICD-10-CM | POA: Diagnosis not present

## 2016-07-13 DIAGNOSIS — G4700 Insomnia, unspecified: Secondary | ICD-10-CM | POA: Diagnosis not present

## 2016-07-14 DIAGNOSIS — I252 Old myocardial infarction: Secondary | ICD-10-CM | POA: Diagnosis not present

## 2016-07-14 DIAGNOSIS — R609 Edema, unspecified: Secondary | ICD-10-CM | POA: Diagnosis not present

## 2016-07-14 DIAGNOSIS — Z950 Presence of cardiac pacemaker: Secondary | ICD-10-CM | POA: Diagnosis not present

## 2016-07-14 DIAGNOSIS — I10 Essential (primary) hypertension: Secondary | ICD-10-CM | POA: Diagnosis not present

## 2016-07-14 DIAGNOSIS — I4891 Unspecified atrial fibrillation: Secondary | ICD-10-CM | POA: Diagnosis not present

## 2016-07-14 DIAGNOSIS — Z7901 Long term (current) use of anticoagulants: Secondary | ICD-10-CM | POA: Diagnosis not present

## 2016-07-18 DIAGNOSIS — L821 Other seborrheic keratosis: Secondary | ICD-10-CM | POA: Diagnosis not present

## 2016-07-18 DIAGNOSIS — L820 Inflamed seborrheic keratosis: Secondary | ICD-10-CM | POA: Diagnosis not present

## 2016-07-27 DIAGNOSIS — R609 Edema, unspecified: Secondary | ICD-10-CM | POA: Diagnosis not present

## 2016-07-27 DIAGNOSIS — Z7901 Long term (current) use of anticoagulants: Secondary | ICD-10-CM | POA: Diagnosis not present

## 2016-07-27 DIAGNOSIS — I10 Essential (primary) hypertension: Secondary | ICD-10-CM | POA: Diagnosis not present

## 2016-07-27 DIAGNOSIS — Z950 Presence of cardiac pacemaker: Secondary | ICD-10-CM | POA: Diagnosis not present

## 2016-07-27 DIAGNOSIS — I4891 Unspecified atrial fibrillation: Secondary | ICD-10-CM | POA: Diagnosis not present

## 2016-07-27 DIAGNOSIS — I252 Old myocardial infarction: Secondary | ICD-10-CM | POA: Diagnosis not present

## 2016-08-02 DIAGNOSIS — G4700 Insomnia, unspecified: Secondary | ICD-10-CM | POA: Diagnosis not present

## 2016-08-08 DIAGNOSIS — G4700 Insomnia, unspecified: Secondary | ICD-10-CM | POA: Diagnosis not present

## 2016-08-08 DIAGNOSIS — Z7901 Long term (current) use of anticoagulants: Secondary | ICD-10-CM | POA: Diagnosis not present

## 2016-08-08 DIAGNOSIS — R609 Edema, unspecified: Secondary | ICD-10-CM | POA: Diagnosis not present

## 2016-08-08 DIAGNOSIS — I4891 Unspecified atrial fibrillation: Secondary | ICD-10-CM | POA: Diagnosis not present

## 2016-08-08 DIAGNOSIS — I252 Old myocardial infarction: Secondary | ICD-10-CM | POA: Diagnosis not present

## 2016-08-10 DIAGNOSIS — R05 Cough: Secondary | ICD-10-CM | POA: Diagnosis not present

## 2016-08-24 DIAGNOSIS — Z950 Presence of cardiac pacemaker: Secondary | ICD-10-CM | POA: Diagnosis not present

## 2016-08-24 DIAGNOSIS — R609 Edema, unspecified: Secondary | ICD-10-CM | POA: Diagnosis not present

## 2016-08-24 DIAGNOSIS — I10 Essential (primary) hypertension: Secondary | ICD-10-CM | POA: Diagnosis not present

## 2016-08-24 DIAGNOSIS — Z7901 Long term (current) use of anticoagulants: Secondary | ICD-10-CM | POA: Diagnosis not present

## 2016-08-24 DIAGNOSIS — I252 Old myocardial infarction: Secondary | ICD-10-CM | POA: Diagnosis not present

## 2016-08-24 DIAGNOSIS — I4891 Unspecified atrial fibrillation: Secondary | ICD-10-CM | POA: Diagnosis not present

## 2016-08-30 DIAGNOSIS — G4700 Insomnia, unspecified: Secondary | ICD-10-CM | POA: Diagnosis not present

## 2016-09-07 DIAGNOSIS — Z7901 Long term (current) use of anticoagulants: Secondary | ICD-10-CM | POA: Diagnosis not present

## 2016-09-07 DIAGNOSIS — R609 Edema, unspecified: Secondary | ICD-10-CM | POA: Diagnosis not present

## 2016-09-07 DIAGNOSIS — I252 Old myocardial infarction: Secondary | ICD-10-CM | POA: Diagnosis not present

## 2016-09-07 DIAGNOSIS — G4700 Insomnia, unspecified: Secondary | ICD-10-CM | POA: Diagnosis not present

## 2016-09-07 DIAGNOSIS — I4891 Unspecified atrial fibrillation: Secondary | ICD-10-CM | POA: Diagnosis not present

## 2016-09-14 DIAGNOSIS — G4700 Insomnia, unspecified: Secondary | ICD-10-CM | POA: Diagnosis not present

## 2016-09-24 DIAGNOSIS — I252 Old myocardial infarction: Secondary | ICD-10-CM | POA: Diagnosis not present

## 2016-09-24 DIAGNOSIS — I10 Essential (primary) hypertension: Secondary | ICD-10-CM | POA: Diagnosis not present

## 2016-09-24 DIAGNOSIS — Z950 Presence of cardiac pacemaker: Secondary | ICD-10-CM | POA: Diagnosis not present

## 2016-09-24 DIAGNOSIS — R609 Edema, unspecified: Secondary | ICD-10-CM | POA: Diagnosis not present

## 2016-09-24 DIAGNOSIS — Z7901 Long term (current) use of anticoagulants: Secondary | ICD-10-CM | POA: Diagnosis not present

## 2016-09-24 DIAGNOSIS — I4891 Unspecified atrial fibrillation: Secondary | ICD-10-CM | POA: Diagnosis not present

## 2016-09-28 DIAGNOSIS — F0391 Unspecified dementia with behavioral disturbance: Secondary | ICD-10-CM | POA: Diagnosis not present

## 2016-09-28 DIAGNOSIS — G4700 Insomnia, unspecified: Secondary | ICD-10-CM | POA: Diagnosis not present

## 2016-10-08 DIAGNOSIS — R609 Edema, unspecified: Secondary | ICD-10-CM | POA: Diagnosis not present

## 2016-10-08 DIAGNOSIS — I252 Old myocardial infarction: Secondary | ICD-10-CM | POA: Diagnosis not present

## 2016-10-08 DIAGNOSIS — G4700 Insomnia, unspecified: Secondary | ICD-10-CM | POA: Diagnosis not present

## 2016-10-08 DIAGNOSIS — Z7901 Long term (current) use of anticoagulants: Secondary | ICD-10-CM | POA: Diagnosis not present

## 2016-10-08 DIAGNOSIS — I4891 Unspecified atrial fibrillation: Secondary | ICD-10-CM | POA: Diagnosis not present

## 2016-10-12 ENCOUNTER — Encounter (HOSPITAL_COMMUNITY): Payer: Self-pay | Admitting: Emergency Medicine

## 2016-10-12 ENCOUNTER — Emergency Department (HOSPITAL_COMMUNITY)
Admission: EM | Admit: 2016-10-12 | Discharge: 2016-10-13 | Disposition: A | Discharge status: Home / Self Care | Payer: Medicare Other | Attending: Emergency Medicine | Admitting: Emergency Medicine

## 2016-10-12 ENCOUNTER — Emergency Department (HOSPITAL_COMMUNITY): Payer: Medicare Other

## 2016-10-12 DIAGNOSIS — Z79899 Other long term (current) drug therapy: Secondary | ICD-10-CM | POA: Insufficient documentation

## 2016-10-12 DIAGNOSIS — Z7901 Long term (current) use of anticoagulants: Secondary | ICD-10-CM | POA: Insufficient documentation

## 2016-10-12 DIAGNOSIS — R509 Fever, unspecified: Secondary | ICD-10-CM | POA: Diagnosis not present

## 2016-10-12 DIAGNOSIS — I252 Old myocardial infarction: Secondary | ICD-10-CM | POA: Diagnosis not present

## 2016-10-12 DIAGNOSIS — R110 Nausea: Secondary | ICD-10-CM | POA: Diagnosis not present

## 2016-10-12 DIAGNOSIS — R112 Nausea with vomiting, unspecified: Secondary | ICD-10-CM | POA: Diagnosis not present

## 2016-10-12 DIAGNOSIS — R1110 Vomiting, unspecified: Secondary | ICD-10-CM

## 2016-10-12 DIAGNOSIS — R197 Diarrhea, unspecified: Secondary | ICD-10-CM | POA: Insufficient documentation

## 2016-10-12 DIAGNOSIS — G309 Alzheimer's disease, unspecified: Secondary | ICD-10-CM | POA: Insufficient documentation

## 2016-10-12 DIAGNOSIS — R1111 Vomiting without nausea: Secondary | ICD-10-CM | POA: Diagnosis not present

## 2016-10-12 LAB — COMPREHENSIVE METABOLIC PANEL
ALBUMIN: 2.9 g/dL — AB (ref 3.5–5.0)
ALT: 16 U/L (ref 14–54)
AST: 30 U/L (ref 15–41)
Alkaline Phosphatase: 87 U/L (ref 38–126)
Anion gap: 7 (ref 5–15)
BUN: 18 mg/dL (ref 6–20)
CO2: 25 mmol/L (ref 22–32)
Calcium: 8.1 mg/dL — ABNORMAL LOW (ref 8.9–10.3)
Chloride: 98 mmol/L — ABNORMAL LOW (ref 101–111)
Creatinine, Ser: 0.64 mg/dL (ref 0.44–1.00)
GFR calc Af Amer: >60 mL/min (ref >60)
GFR calc non Af Amer: >60 mL/min (ref >60)
Glucose, Bld: 119 mg/dL — ABNORMAL HIGH (ref 65–99)
Potassium: 4.4 mmol/L (ref 3.5–5.1)
Sodium: 130 mmol/L — ABNORMAL LOW (ref 135–145)
Total Bilirubin: 2.3 mg/dL — ABNORMAL HIGH (ref 0.3–1.2)
Total Protein: 6.9 g/dL (ref 6.5–8.1)

## 2016-10-12 LAB — CBC
HCT: 40.6 % (ref 36.0–46.0)
Hemoglobin: 14 g/dL (ref 12.0–15.0)
MCH: 32.6 pg (ref 26.0–34.0)
MCHC: 34.5 g/dL (ref 30.0–36.0)
MCV: 94.6 fL (ref 78.0–100.0)
Platelets: 162 10*3/uL (ref 150–400)
RBC: 4.29 MIL/uL (ref 3.87–5.11)
RDW: 15.3 % (ref 11.5–15.5)
WBC: 9.6 10*3/uL (ref 4.0–10.5)

## 2016-10-12 LAB — LIPASE, BLOOD: Lipase: 14 U/L (ref 11–51)

## 2016-10-12 MED ORDER — ONDANSETRON 4 MG PO TBDP: 4.0000 mg | ORAL_TABLET | Freq: Once | ORAL | Status: DC | PRN

## 2016-10-12 MED ORDER — ONDANSETRON HCL 4 MG/2ML IJ SOLN
4.0000 mg | Freq: Once | INTRAMUSCULAR | Status: AC
  Administered 2016-10-12: 4 mg via INTRAVENOUS
  Filled 2016-10-12: qty 2

## 2016-10-12 MED ORDER — ONDANSETRON 8 MG PO TBDP: 8.0000 mg | ORAL_TABLET | Freq: Three times a day (TID) | ORAL | 0 refills | Status: AC | PRN

## 2016-10-12 MED ORDER — SODIUM CHLORIDE 0.9 % IV BOLUS (SEPSIS)
1000.0000 mL | Freq: Once | INTRAVENOUS | Status: AC
  Administered 2016-10-12: 1000 mL via INTRAVENOUS

## 2016-10-12 NOTE — ED Triage Notes (Signed)
Per EMS, the facility pt is from decided to call EMS d/t patient vomiting and having diarrhea today, as well as running a low grade fever.  Pt denies abdominal pain but states that she is nauseated.  Pt has a pacemaker.

## 2016-10-12 NOTE — ED Provider Notes (Signed)
[facility] DEPT Provider Note   CSN: [id number] Arrival date & time: 10/12/16  2122     History   Chief Complaint Chief Complaint  Patient presents with  . Emesis  . Diarrhea    HPI Rhonda Smith is a 83 y.o. female. Patient presents emergency department with nausea vomiting and diarrhea today.  Nursing facility reports possible low-grade fever.  No altered mental status.  Patient denies abdominal pain.  No known sick contacts.  Denies melena and hematochezia.  Denies hematemesis.  Symptoms are moderate in severity.  She reports decreased oral intake today secondary to vomiting.      Past Medical History:  Diagnosis Date  . Alzheimer's disease   . Complete heart block ([place])    a. 06/2008 s/p CRT-D.  . Edema leg   . History of ischemic cardiomyopathy    a. 06/2008 s/p BSX N118 C[employer]is 100-D Bi-V ICD, ser # [id number];  b. 04/2013 Echo: EF 50-55%, no RWMA, Gr 2 DD, trace MR, mild to mod AI, mild Ao root dil (3.8cm @ Sinus of Valsalva);  c. 10/2013 ICD therapies turned off.  . Mild aortic insufficiency    a. 04/2013 Echo: Mild - Mod AI.  Marland Kitchen Myocardial infarction    a. 2008.  Marland Kitchen PAF (paroxysmal atrial fibrillation) (HCC)    a. chronic coumadin;  b. 10/2013 amio discontinued.  . Right rib fracture    a. 05/2014 in setting of fall.  . Stasis dermatitis     Patient Active Problem List   Diagnosis Date Noted  . Fall 05/12/2014  . Rib fractures 05/10/2014  . Atrial fibrillation ([place]) 07/11/2013  . Chronic systolic heart failure ([place]) 07/11/2013  . Complete heart block ([place]) 07/11/2013  . ICD (implantable cardioverter-defibrillator), biventricular, in situ 07/11/2013  . Dementia 07/11/2013    Past Surgical History:  Procedure Laterality Date  . KNEE ARTHROSCOPY     right  . PACEMAKER INSERTION    . TOTAL SHOULDER REPLACEMENT      OB History    No data available       Home Medications    Prior to Admission medications   Medication Sig Start Date End Date Taking?  Authorizing Provider  acetaminophen (TYLENOL) 325 MG tablet Take 650 mg by mouth every 6 (six) hours as needed for mild pain.   Yes Historical Provider, MD  lamoTRIgine (LAMICTAL) 25 MG tablet Take 25 mg by mouth 2 (two) times daily.   Yes Historical Provider, MD  metoprolol succinate (TOPROL-XL) 25 MG 24 hr tablet Take 25 mg by mouth daily.   Yes Historical Provider, MD  traZODone (DESYREL) 50 MG tablet Take 100 mg by mouth at bedtime.    Yes Historical Provider, MD  warfarin (COUMADIN) 3 MG tablet Take 3 mg by mouth daily. On Monday, Wednesday and Friday   Yes Historical Provider, MD  warfarin (COUMADIN) 5 MG tablet Take 5 mg by mouth See admin instructions. Take 1 tab every Sun, Tues, Thurs, Sat   Yes Historical Provider, MD  HYDROcodone-acetaminophen (NORCO) 5-325 MG per tablet Take 1-2 tablets by mouth every 6 (six) hours as needed. Patient not taking: Reported on 10/12/2016 10/14/14   Veryl Speak, MD  HYDROcodone-acetaminophen (NORCO/VICODIN) 5-325 MG per tablet Take 1 tablet by mouth every 4 (four) hours as needed for moderate pain or severe pain. Patient not taking: Reported on 10/12/2016 10/15/14   Virgel Manifold, MD  ondansetron (ZOFRAN ODT) 8 MG disintegrating tablet Take 1 tablet (8 mg total) by mouth every  8 (eight) hours as needed for nausea or vomiting. 10/12/16   Jola Schmidt, MD    Family History History reviewed. No pertinent family history.  Social History Social History  Substance Use Topics  . Smoking status: Never Smoker  . Smokeless tobacco: Never Used  . Alcohol use Not on file     Allergies   Sulfa antibiotics   Review of Systems Review of Systems  All other systems reviewed and are negative.    Physical Exam Updated Vital Signs BP (!) 159/109 (BP Location: Left Arm)   Pulse 112   Temp 99.4 F (37.4 C) (Oral)   Resp 25   Ht 5\' 3"  (1.6 m)   Wt 146 lb (66.2 kg)   SpO2 93%   BMI 25.86 kg/m   Physical Exam  Constitutional: She is oriented to person, place,  and time. She appears well-developed and well-nourished. No distress.  HENT:  Head: Normocephalic and atraumatic.  Eyes: EOM are normal.  Neck: Normal range of motion.  Cardiovascular: Normal rate, regular rhythm and normal heart sounds.   Pulmonary/Chest: Effort normal and breath sounds normal.  Abdominal: Soft. She exhibits no distension. There is no tenderness.  Musculoskeletal: Normal range of motion.  Neurological: She is alert and oriented to person, place, and time.  Skin: Skin is warm and dry.  Psychiatric: She has a normal mood and affect. Judgment normal.  Nursing note and vitals reviewed.    ED Treatments / Results  Labs (all labs ordered are listed, but only abnormal results are displayed) Labs Reviewed  COMPREHENSIVE METABOLIC PANEL - Abnormal; Notable for the following:       Result Value   Sodium 130 (*)    Chloride 98 (*)    Glucose, Bld 119 (*)    Calcium 8.1 (*)    Albumin 2.9 (*)    Total Bilirubin 2.3 (*)    All other components within normal limits  LIPASE, BLOOD  CBC  URINALYSIS, ROUTINE W REFLEX MICROSCOPIC    EKG  EKG Interpretation None       Radiology Dg Chest 2 View  Result Date: 10/12/2016 CLINICAL DATA:  Nausea and vomiting today.  Low-grade fever. EXAM: CHEST  2 VIEW COMPARISON:  05/10/2014 FINDINGS: Mild vascular and interstitial prominence, new. Small pleural effusions bilaterally. No dense confluent consolidation. Moderate cardiomegaly. Intact appearances of the transvenous cardiac leads. IMPRESSION: Vascular and interstitial changes suggest mild congestive heart failure. There also or small pleural effusions. No focal dense consolidation Electronically Signed   By: Andreas Newport M.D.   On: 10/12/2016 22:44   Dg Abd 2 Views  Result Date: 10/12/2016 CLINICAL DATA:  Nausea today. EXAM: ABDOMEN - 2 VIEW COMPARISON:  None. FINDINGS: The bowel gas pattern is normal. There is no evidence of free air. No radio-opaque calculi or other  significant radiographic abnormality is seen. IMPRESSION: Negative. Electronically Signed   By: Andreas Newport M.D.   On: 10/12/2016 22:45    Procedures Procedures (including critical care time)  Medications Ordered in ED Medications  sodium chloride 0.9 % bolus 1,000 mL (1,000 mLs Intravenous New Bag/Given 10/12/16 2216)  ondansetron (ZOFRAN) injection 4 mg (4 mg Intravenous Given 10/12/16 2216)     Initial Impression / Assessment and Plan / ED Course  I have reviewed the triage vital signs and the nursing notes.  Pertinent labs & imaging results that were available during my care of the patient were reviewed by me and considered in my medical decision making (see chart  for details).  Clinical Course     11:58 PM Patient feels much better this time.  She is keeping fluids down.  She feels better would like to go home.  Likely viral illness.  Final Clinical Impressions(s) / ED Diagnoses   Final diagnoses:  Vomiting  Nausea vomiting and diarrhea    New Prescriptions New Prescriptions   ONDANSETRON (ZOFRAN ODT) 8 MG DISINTEGRATING TABLET    Take 1 tablet (8 mg total) by mouth every 8 (eight) hours as needed for nausea or vomiting.     Jola Schmidt, MD 10/13/16 0000

## 2016-10-12 NOTE — ED Notes (Signed)
Bed: [id number] Expected date:  Expected time:  Means of arrival:  Comments: N,v

## 2016-10-13 DIAGNOSIS — R112 Nausea with vomiting, unspecified: Secondary | ICD-10-CM | POA: Diagnosis not present

## 2016-10-13 DIAGNOSIS — K591 Functional diarrhea: Secondary | ICD-10-CM | POA: Diagnosis not present

## 2016-10-13 NOTE — ED Notes (Signed)
Patient waiting on ptar.

## 2016-10-19 DIAGNOSIS — R609 Edema, unspecified: Secondary | ICD-10-CM | POA: Diagnosis not present

## 2016-10-19 DIAGNOSIS — R0602 Shortness of breath: Secondary | ICD-10-CM | POA: Diagnosis not present

## 2016-10-19 DIAGNOSIS — M25519 Pain in unspecified shoulder: Secondary | ICD-10-CM | POA: Diagnosis not present

## 2016-10-19 DIAGNOSIS — R296 Repeated falls: Secondary | ICD-10-CM | POA: Diagnosis not present

## 2016-10-20 DIAGNOSIS — R0602 Shortness of breath: Secondary | ICD-10-CM | POA: Diagnosis not present

## 2016-10-20 DIAGNOSIS — M25562 Pain in left knee: Secondary | ICD-10-CM | POA: Diagnosis not present

## 2016-10-20 DIAGNOSIS — M25511 Pain in right shoulder: Secondary | ICD-10-CM | POA: Diagnosis not present

## 2016-10-21 ENCOUNTER — Inpatient Hospital Stay (HOSPITAL_COMMUNITY)
Admission: EM | Admit: 2016-10-21 | Discharge: 2016-11-10 | DRG: 082 | Disposition: E | Payer: Medicare Other | Attending: Family Medicine | Admitting: Family Medicine

## 2016-10-21 ENCOUNTER — Emergency Department (HOSPITAL_COMMUNITY): Payer: Medicare Other

## 2016-10-21 ENCOUNTER — Encounter (HOSPITAL_COMMUNITY): Payer: Self-pay | Admitting: Emergency Medicine

## 2016-10-21 DIAGNOSIS — I5022 Chronic systolic (congestive) heart failure: Secondary | ICD-10-CM | POA: Diagnosis not present

## 2016-10-21 DIAGNOSIS — I4891 Unspecified atrial fibrillation: Secondary | ICD-10-CM | POA: Diagnosis present

## 2016-10-21 DIAGNOSIS — W19XXXA Unspecified fall, initial encounter: Secondary | ICD-10-CM | POA: Diagnosis present

## 2016-10-21 DIAGNOSIS — G918 Other hydrocephalus: Secondary | ICD-10-CM | POA: Diagnosis present

## 2016-10-21 DIAGNOSIS — I2510 Atherosclerotic heart disease of native coronary artery without angina pectoris: Secondary | ICD-10-CM | POA: Diagnosis present

## 2016-10-21 DIAGNOSIS — I482 Chronic atrial fibrillation: Secondary | ICD-10-CM

## 2016-10-21 DIAGNOSIS — S065X9A Traumatic subdural hemorrhage with loss of consciousness of unspecified duration, initial encounter: Secondary | ICD-10-CM | POA: Diagnosis present

## 2016-10-21 DIAGNOSIS — Z96619 Presence of unspecified artificial shoulder joint: Secondary | ICD-10-CM | POA: Diagnosis present

## 2016-10-21 DIAGNOSIS — Z66 Do not resuscitate: Secondary | ICD-10-CM | POA: Diagnosis present

## 2016-10-21 DIAGNOSIS — G309 Alzheimer's disease, unspecified: Secondary | ICD-10-CM | POA: Diagnosis present

## 2016-10-21 DIAGNOSIS — R29810 Facial weakness: Secondary | ICD-10-CM | POA: Diagnosis present

## 2016-10-21 DIAGNOSIS — G9340 Encephalopathy, unspecified: Secondary | ICD-10-CM | POA: Diagnosis present

## 2016-10-21 DIAGNOSIS — I480 Paroxysmal atrial fibrillation: Secondary | ICD-10-CM | POA: Diagnosis present

## 2016-10-21 DIAGNOSIS — S8002XA Contusion of left knee, initial encounter: Secondary | ICD-10-CM | POA: Diagnosis not present

## 2016-10-21 DIAGNOSIS — R4182 Altered mental status, unspecified: Secondary | ICD-10-CM | POA: Diagnosis not present

## 2016-10-21 DIAGNOSIS — Z79899 Other long term (current) drug therapy: Secondary | ICD-10-CM

## 2016-10-21 DIAGNOSIS — I442 Atrioventricular block, complete: Secondary | ICD-10-CM

## 2016-10-21 DIAGNOSIS — Z7901 Long term (current) use of anticoagulants: Secondary | ICD-10-CM | POA: Diagnosis not present

## 2016-10-21 DIAGNOSIS — R2990 Unspecified symptoms and signs involving the nervous system: Secondary | ICD-10-CM

## 2016-10-21 DIAGNOSIS — F0280 Dementia in other diseases classified elsewhere without behavioral disturbance: Secondary | ICD-10-CM | POA: Diagnosis present

## 2016-10-21 DIAGNOSIS — G935 Compression of brain: Secondary | ICD-10-CM | POA: Diagnosis present

## 2016-10-21 DIAGNOSIS — R402352 Coma scale, best motor response, localizes pain, at arrival to emergency department: Secondary | ICD-10-CM | POA: Diagnosis present

## 2016-10-21 DIAGNOSIS — Z515 Encounter for palliative care: Secondary | ICD-10-CM

## 2016-10-21 DIAGNOSIS — R4781 Slurred speech: Secondary | ICD-10-CM | POA: Diagnosis present

## 2016-10-21 DIAGNOSIS — R1310 Dysphagia, unspecified: Secondary | ICD-10-CM | POA: Diagnosis present

## 2016-10-21 DIAGNOSIS — R402212 Coma scale, best verbal response, none, at arrival to emergency department: Secondary | ICD-10-CM | POA: Diagnosis present

## 2016-10-21 DIAGNOSIS — I6200 Nontraumatic subdural hemorrhage, unspecified: Secondary | ICD-10-CM | POA: Diagnosis not present

## 2016-10-21 DIAGNOSIS — I639 Cerebral infarction, unspecified: Secondary | ICD-10-CM | POA: Diagnosis not present

## 2016-10-21 DIAGNOSIS — I252 Old myocardial infarction: Secondary | ICD-10-CM | POA: Diagnosis not present

## 2016-10-21 DIAGNOSIS — I959 Hypotension, unspecified: Secondary | ICD-10-CM | POA: Diagnosis present

## 2016-10-21 DIAGNOSIS — G8194 Hemiplegia, unspecified affecting left nondominant side: Secondary | ICD-10-CM | POA: Diagnosis present

## 2016-10-21 DIAGNOSIS — R791 Abnormal coagulation profile: Secondary | ICD-10-CM | POA: Diagnosis present

## 2016-10-21 DIAGNOSIS — Z95810 Presence of automatic (implantable) cardiac defibrillator: Secondary | ICD-10-CM

## 2016-10-21 DIAGNOSIS — R402431 Glasgow coma scale score 3-8, in the field [EMT or ambulance]: Secondary | ICD-10-CM | POA: Diagnosis not present

## 2016-10-21 DIAGNOSIS — S065XAA Traumatic subdural hemorrhage with loss of consciousness status unknown, initial encounter: Secondary | ICD-10-CM | POA: Diagnosis present

## 2016-10-21 DIAGNOSIS — R402112 Coma scale, eyes open, never, at arrival to emergency department: Secondary | ICD-10-CM | POA: Diagnosis present

## 2016-10-21 DIAGNOSIS — F0390 Unspecified dementia without behavioral disturbance: Secondary | ICD-10-CM | POA: Diagnosis present

## 2016-10-21 LAB — DIFFERENTIAL
Basophils Absolute: 0.1 10*3/uL (ref 0.0–0.1)
Basophils Relative: 1 %
EOS PCT: 1 %
Eosinophils Absolute: 0.1 10*3/uL (ref 0.0–0.7)
LYMPHS ABS: 3.2 10*3/uL (ref 0.7–4.0)
LYMPHS PCT: 25 %
Monocytes Absolute: 1.8 10*3/uL — ABNORMAL HIGH (ref 0.1–1.0)
Monocytes Relative: 14 %
NEUTROS ABS: 7.5 10*3/uL (ref 1.7–7.7)
NEUTROS PCT: 59 %

## 2016-10-21 LAB — ETHANOL: Alcohol, Ethyl (B): <5 mg/dL (ref <5)

## 2016-10-21 LAB — CBC
HCT: 40.5 % (ref 36.0–46.0)
HEMOGLOBIN: 13.8 g/dL (ref 12.0–15.0)
MCH: 32.5 pg (ref 26.0–34.0)
MCHC: 34.1 g/dL (ref 30.0–36.0)
MCV: 95.5 fL (ref 78.0–100.0)
Platelets: 164 10*3/uL (ref 150–400)
RBC: 4.24 MIL/uL (ref 3.87–5.11)
RDW: 15.4 % (ref 11.5–15.5)
WBC: 12.7 10*3/uL — ABNORMAL HIGH (ref 4.0–10.5)

## 2016-10-21 LAB — I-STAT CHEM 8, ED
BUN: 13 mg/dL (ref 6–20)
CALCIUM ION: 1.06 mmol/L — AB (ref 1.15–1.40)
CREATININE: 0.6 mg/dL (ref 0.44–1.00)
Chloride: 97 mmol/L — ABNORMAL LOW (ref 101–111)
GLUCOSE: 155 mg/dL — AB (ref 65–99)
HEMATOCRIT: 42 % (ref 36.0–46.0)
HEMOGLOBIN: 14.3 g/dL (ref 12.0–15.0)
Potassium: 4.1 mmol/L (ref 3.5–5.1)
Sodium: 132 mmol/L — ABNORMAL LOW (ref 135–145)
TCO2: 23 mmol/L (ref 0–100)

## 2016-10-21 LAB — POC OCCULT BLOOD, ED: Fecal Occult Bld: POSITIVE — AB

## 2016-10-21 LAB — COMPREHENSIVE METABOLIC PANEL
ALBUMIN: 2.8 g/dL — AB (ref 3.5–5.0)
ALK PHOS: 90 U/L (ref 38–126)
ALT: 20 U/L (ref 14–54)
ANION GAP: 9 (ref 5–15)
AST: 41 U/L (ref 15–41)
BILIRUBIN TOTAL: 2.4 mg/dL — AB (ref 0.3–1.2)
BUN: 11 mg/dL (ref 6–20)
CALCIUM: 8.6 mg/dL — AB (ref 8.9–10.3)
CO2: 24 mmol/L (ref 22–32)
CREATININE: 0.81 mg/dL (ref 0.44–1.00)
Chloride: 98 mmol/L — ABNORMAL LOW (ref 101–111)
GFR calc Af Amer: >60 mL/min (ref >60)
GFR calc non Af Amer: >60 mL/min (ref >60)
GLUCOSE: 147 mg/dL — AB (ref 65–99)
Potassium: 4.2 mmol/L (ref 3.5–5.1)
Sodium: 131 mmol/L — ABNORMAL LOW (ref 135–145)
TOTAL PROTEIN: 6.8 g/dL (ref 6.5–8.1)

## 2016-10-21 LAB — I-STAT VENOUS BLOOD GAS, ED
ACID-BASE DEFICIT: 1 mmol/L (ref 0.0–2.0)
Bicarbonate: 23.1 mmol/L (ref 20.0–28.0)
O2 SAT: 87 %
PH VEN: 7.398 (ref 7.250–7.430)
TCO2: 24 mmol/L (ref 0–100)
pCO2, Ven: 37.4 mmHg — ABNORMAL LOW (ref 44.0–60.0)
pO2, Ven: 53 mmHg — ABNORMAL HIGH (ref 32.0–45.0)

## 2016-10-21 LAB — I-STAT TROPONIN, ED: Troponin i, poc: 0.05 ng/mL (ref 0.00–0.08)

## 2016-10-21 LAB — PROTIME-INR
INR: 6.82 — AB
Prothrombin Time: 61.3 seconds — ABNORMAL HIGH (ref 11.4–15.2)

## 2016-10-21 LAB — APTT: aPTT: 54 seconds — ABNORMAL HIGH (ref 24–36)

## 2016-10-21 MED ORDER — LORAZEPAM 2 MG/ML PO CONC
2.0000 mg | Freq: Four times a day (QID) | ORAL | Status: DC | PRN
  Administered 2016-10-22: 2 mg via ORAL
  Filled 2016-10-21: qty 1

## 2016-10-21 MED ORDER — MANNITOL 20 % IV SOLN
70.0000 g | INTRAVENOUS | Status: DC
  Administered 2016-10-21: 70 g via INTRAVENOUS
  Filled 2016-10-21 (×2): qty 500

## 2016-10-21 MED ORDER — MORPHINE SULFATE (PF) 4 MG/ML IV SOLN
2.0000 mg | INTRAVENOUS | Status: DC | PRN
  Administered 2016-10-21: 2 mg via INTRAVENOUS
  Filled 2016-10-21: qty 1

## 2016-10-21 MED ORDER — MANNITOL 25 % IV SOLN
70.0000 g | Freq: Once | INTRAVENOUS | Status: DC
  Filled 2016-10-21: qty 280

## 2016-10-21 MED ORDER — MORPHINE SULFATE (PF) 2 MG/ML IV SOLN
2.0000 mg | INTRAVENOUS | Status: DC | PRN
  Administered 2016-10-22 (×3): 2 mg via INTRAVENOUS
  Filled 2016-10-21 (×4): qty 1

## 2016-10-21 MED ORDER — EMPTY CONTAINERS FLEXIBLE MISC: 70.0000 g | Freq: Once | Status: DC

## 2016-10-21 MED ORDER — MORPHINE SULFATE (CONCENTRATE) 10 MG/0.5ML PO SOLN
10.0000 mg | ORAL | Status: DC | PRN
  Administered 2016-10-21: 10 mg via SUBLINGUAL
  Filled 2016-10-21: qty 0.5

## 2016-10-21 MED ORDER — MORPHINE SULFATE (PF) 4 MG/ML IV SOLN
INTRAVENOUS | Status: AC
  Administered 2016-10-21: 2 mg
  Filled 2016-10-21: qty 1

## 2016-10-21 MED ORDER — SCOPOLAMINE 1 MG/3DAYS TD PT72
1.0000 | MEDICATED_PATCH | TRANSDERMAL | Status: DC
  Administered 2016-10-22: 1.5 mg via TRANSDERMAL
  Filled 2016-10-21 (×2): qty 1

## 2016-10-21 MED ORDER — NAPHAZOLINE-GLYCERIN 0.012-0.2 % OP SOLN: 1.0000 [drp] | Freq: Four times a day (QID) | OPHTHALMIC | Status: DC | PRN

## 2016-10-21 NOTE — Progress Notes (Signed)
Rt responded to Trauma B for pt with AMS. Pt arrived on NRB withj spo2 98%. Pt taken off to Room Air after 10 minutes in ED. Spo2 remained 95%. Pt with bilateral coarse crackles throughout. Rt not needed at this time. RT will continue to monitor.

## 2016-10-21 NOTE — ED Notes (Signed)
Dr. Leanne Lovely and resident performed pupil exam at bedside, CT head ordered. Large bruise to entire left leg noted. Niece at bedside, states patient fell on Wednesday and was seen at [facility] long then. Patient last saw patient today at 1200, and reports patient was talking and eating lunch. Patient nonverbal now and on nonrebreather. EDP stated to remove to check room air 02 saturation. 98% on room air.

## 2016-10-21 NOTE — ED Triage Notes (Addendum)
Pt from facility, staff states patient confused yesterday. Baseline AO x4. Hx of alzheimers. Staff called for nonresponsive. EMS states pt cyanotic and tachypenic. Skin clammy to touch, crackles heard, lasix started today. CBG 119, 138/106, HR paced 96, pulse ox picked up on nonrebreather at 98%. EMS states pupils nonreactive. Facility admits recent fall but provided no details of when or injuries noted. Facial droop noted per EMS. LKW unknown. Patient on warfarin.No family at bedside at this time.

## 2016-10-21 NOTE — ED Notes (Signed)
Family at bedside thinks that the pt is comfortable and does not need med at this time

## 2016-10-21 NOTE — ED Notes (Signed)
Pt on NRB at this time with labored breathing.

## 2016-10-21 NOTE — ED Notes (Signed)
The pt has started wheezing somewhat   Moving her feet    Pain med given  Admitting doctor at the bedside

## 2016-10-21 NOTE — ED Provider Notes (Signed)
[facility] DEPT Provider Note   CSN: [id number] Arrival date & time: 11/04/2016  1729     History   Chief Complaint Chief Complaint  Patient presents with  . Altered Mental Status    HPI Rhonda Smith is a 83 y.o. female.  HPI  History provided by [place] and by the Niece at bedside. Patient was feeling a little "off" this morning however the niece states that she was with her today at noon and she was neurologically normal and at her baseline mental status. She was laughing and joking and eating food. She has no complaints at that time. The facility states that she fell 2 days ago while trying to transition to a wheelchair and she fell on her bottom and did not hit her head. No other falls reported other facility. They state that her baseline mental status is "forgetful" but alert and oriented and pleasant. The facility states that around 5pm she was noted to have left-sided facial droop, slurred speech, difficulty swallowing, left arm and leg weakness. The nurse at the facility stated that she tried to give her her medicines and she was unable to swallow water. She is not had any fevers, nausea, vomiting, diarrhea, cough, sputum production.  Past Medical History:  Diagnosis Date  . Alzheimer's disease   . Complete heart block ([place])    a. 06/2008 s/p CRT-D.  . Edema leg   . History of ischemic cardiomyopathy    a. 06/2008 s/p BSX N118 C[employer]is 100-D Bi-V ICD, ser # [id number];  b. 04/2013 Echo: EF 50-55%, no RWMA, Gr 2 DD, trace MR, mild to mod AI, mild Ao root dil (3.8cm @ Sinus of Valsalva);  c. 10/2013 ICD therapies turned off.  . Mild aortic insufficiency    a. 04/2013 Echo: Mild - Mod AI.  Marland Kitchen Myocardial infarction    a. 2008.  Marland Kitchen PAF (paroxysmal atrial fibrillation) (HCC)    a. chronic coumadin;  b. 10/2013 amio discontinued.  . Right rib fracture    a. 05/2014 in setting of fall.  . Stasis dermatitis     Patient Active Problem List   Diagnosis Date Noted  . SDH  (subdural hematoma) ([place]) 10/12/2016  . Acute encephalopathy 10/21/2016  . Fall 05/12/2014  . Rib fractures 05/10/2014  . Atrial fibrillation ([place]) 07/11/2013  . Chronic systolic heart failure ([place]) 07/11/2013  . Complete heart block ([place]) 07/11/2013  . ICD (implantable cardioverter-defibrillator), biventricular, in situ 07/11/2013  . Dementia 07/11/2013    Past Surgical History:  Procedure Laterality Date  . KNEE ARTHROSCOPY     right  . PACEMAKER INSERTION    . TOTAL SHOULDER REPLACEMENT      OB History    No data available       Home Medications    Prior to Admission medications   Medication Sig Start Date End Date Taking? Authorizing Provider  acetaminophen (TYLENOL) 325 MG tablet Take 650 mg by mouth every 6 (six) hours as needed for mild pain.    Historical Provider, MD  HYDROcodone-acetaminophen (NORCO) 5-325 MG per tablet Take 1-2 tablets by mouth every 6 (six) hours as needed. Patient not taking: Reported on 10/12/2016 10/14/14   Veryl Speak, MD  HYDROcodone-acetaminophen (NORCO/VICODIN) 5-325 MG per tablet Take 1 tablet by mouth every 4 (four) hours as needed for moderate pain or severe pain. Patient not taking: Reported on 10/12/2016 10/15/14   Virgel Manifold, MD  lamoTRIgine (LAMICTAL) 25 MG tablet Take 25 mg by mouth 2 (  two) times daily.    Historical Provider, MD  metoprolol succinate (TOPROL-XL) 25 MG 24 hr tablet Take 25 mg by mouth daily.    Historical Provider, MD  ondansetron (ZOFRAN ODT) 8 MG disintegrating tablet Take 1 tablet (8 mg total) by mouth every 8 (eight) hours as needed for nausea or vomiting. 10/12/16   Jola Schmidt, MD  traZODone (DESYREL) 50 MG tablet Take 100 mg by mouth at bedtime.     Historical Provider, MD  warfarin (COUMADIN) 3 MG tablet Take 3 mg by mouth daily. On Monday, Wednesday and Friday    Historical Provider, MD  warfarin (COUMADIN) 5 MG tablet Take 5 mg by mouth See admin instructions. Take 1 tab every Sun, Tues, Thurs, Sat    Historical  Provider, MD    Family History History reviewed. No pertinent family history.  Social History Social History  Substance Use Topics  . Smoking status: Never Smoker  . Smokeless tobacco: Never Used  . Alcohol use Not on file     Allergies   Sulfa antibiotics   Review of Systems Review of Systems  Unable to perform ROS: Patient unresponsive     Physical Exam Updated Vital Signs BP 120/64 (BP Location: Left Arm)   Pulse 77   Temp 97.5 F (36.4 C) (Axillary)   Resp 16   Ht 5' (1.524 m)   Wt 70.7 kg   SpO2 100%   BMI 30.44 kg/m   Physical Exam  Constitutional: She appears cachectic. She has a sickly appearance.  HENT:  Head: Normocephalic and atraumatic.  Eyes: Conjunctivae are normal. Right pupil is not reactive. Left pupil is not reactive. Pupils are unequal (R pupil 54mm, L pupil pinpoint.).  Neck: Neck supple.  Cardiovascular: Normal rate, regular rhythm, S1 normal, S2 normal, normal heart sounds, intact distal pulses and normal pulses.   Pulmonary/Chest: No respiratory distress. She has decreased breath sounds. She has rhonchi in the right lower field and the left lower field.  Abdominal: Normal appearance. She exhibits no distension.  Genitourinary: Rectal exam shows guaiac positive stool. Rectal exam shows anal tone normal.  Musculoskeletal:  Large ecchymosis over L knee.  Neurological: She is unresponsive. A cranial nerve deficit (L facial droop) is present. GCS eye subscore is 1. GCS verbal subscore is 1. GCS motor subscore is 5.     ED Treatments / Results  Labs (all labs ordered are listed, but only abnormal results are displayed) Labs Reviewed  PROTIME-INR - Abnormal; Notable for the following:       Result Value   Prothrombin Time 61.3 (*)    INR 6.82 (*)    All other components within normal limits  APTT - Abnormal; Notable for the following:    aPTT 54 (*)    All other components within normal limits  CBC - Abnormal; Notable for the following:     WBC 12.7 (*)    All other components within normal limits  DIFFERENTIAL - Abnormal; Notable for the following:    Monocytes Absolute 1.8 (*)    All other components within normal limits  COMPREHENSIVE METABOLIC PANEL - Abnormal; Notable for the following:    Sodium 131 (*)    Chloride 98 (*)    Glucose, Bld 147 (*)    Calcium 8.6 (*)    Albumin 2.8 (*)    Total Bilirubin 2.4 (*)    All other components within normal limits  I-STAT VENOUS BLOOD GAS, ED - Abnormal; Notable for the following:  pCO2, Ven 37.4 (*)    pO2, Ven 53.0 (*)    All other components within normal limits  POC OCCULT BLOOD, ED - Abnormal; Notable for the following:    Fecal Occult Bld POSITIVE (*)    All other components within normal limits  I-STAT CHEM 8, ED - Abnormal; Notable for the following:    Sodium 132 (*)    Chloride 97 (*)    Glucose, Bld 155 (*)    Calcium, Ion 1.06 (*)    All other components within normal limits  ETHANOL  I-STAT TROPOININ, ED    EKG  EKG Interpretation None       Radiology Dg Chest Portable 1 View  Result Date: 11/05/2016 CLINICAL DATA:  Mental status changes. EXAM: PORTABLE CHEST 1 VIEW COMPARISON:  10/12/2016 FINDINGS: 1741 hours. The cardio pericardial silhouette is enlarged. Left-sided permanent pacemaker again noted. Interstitial markings are diffusely coarsened with chronic features. There is pulmonary vascular congestion without overt pulmonary edema. Left base atelectasis or infiltrate noted with probable tiny left pleural effusion. Bones are diffusely demineralized. Status post left shoulder replacement. Telemetry leads overlie the chest. IMPRESSION: Cardiomegaly with vascular congestion and left base atelectasis or infiltrate with small left pleural effusion. Electronically Signed   By: Misty Stanley M.D.   On: 10/15/2016 18:04   Ct Head Code Stroke Wo Contrast`  Result Date: 10/24/2016 CLINICAL DATA:  Code stroke.  Stroke-like symptoms. EXAM: CT HEAD  WITHOUT CONTRAST TECHNIQUE: Contiguous axial images were obtained from the base of the skull through the vertex without intravenous contrast. COMPARISON:  05/07/2014 CT head. FINDINGS: Brain: Small acute hematoma within the right posterolateral temporal lobe. Large right cerebral convexity subdural hematoma measuring up to 20 mm in thickness with blood products extending over the falx and right greater than left tentorium. Significant associated mass effect on the underlying brain with right to left mass effect at level of septum pellucidum of 15 mm, right to left subfalcine herniation, effacement of basilar cisterns, and right-sided uncal herniation. Additionally there is hydrocephalus of left lateral ventricle likely due to entrapment. No large territory infarct to the brain is identified. There microvascular ischemic changes and small lacunar infarcts within the left lentiform nucleus stable from prior CT. Vascular: Calcific atherosclerosis of cavernous internal carotid arteries. Skull: Normal. Negative for fracture or focal lesion. Sinuses/Orbits: No acute finding. Other: None. IMPRESSION: 1. Large acute right convexity subdural hematoma and small parenchymal hemorrhage within the right posterior temporal lobe. 2. Severe mass effect with 20 mm right-to-left midline shift, effacement of basilar cisterns, right uncal herniation with compression of midbrain, and entrapped left lateral ventricle. These results were called by telephone at the time of interpretation on 10/31/2016 at 6:30 pm to Dr.Taqeer, who verbally acknowledged these results. Electronically Signed   By: Kristine Garbe M.D.   On: 10/10/2016 18:32    Procedures Procedures (including critical care time)  Medications Ordered in ED Medications  scopolamine (TRANSDERM-SCOP) 1 MG/3DAYS 1.5 mg (1.5 mg Transdermal Not Given 10-28-16 0207)  naphazoline-glycerin (CLEAR EYES) ophth solution 1-2 drop (not administered)  LORazepam (ATIVAN) 2  MG/ML concentrated solution 2 mg (not administered)  morphine 2 MG/ML injection 2 mg (not administered)  morphine 4 MG/ML injection (2 mg  Given 10/20/2016 1930)     Initial Impression / Assessment and Plan / ED Course  I have reviewed the triage vital signs and the nursing notes.  Pertinent labs & imaging results that were available during my care of the patient were reviewed  by me and considered in my medical decision making (see chart for details).  Clinical Course    83 year old female brought in unresponsive from an outside facility. On arrival she was afebrile and hemodynamically stable however unresponsive. Exam notable for unequal pupils with pinpoint pupil on the left. Left facial droop. Rhonchi in the lower bases bilaterally. Normal heart sounds. Abdomen benign. Large amount of ecchymosis to the left knee. No obvious trauma to the head and neck. Patient is on Coumadin. CBC, CMP, PT/INR, troponin, VBG ordered. High suspicion for stroke however infectious etiology possible. Code stroke called. Pt taken to CT for CT head stroke protocol. Neurology at bedside.   Labs notable for an INR 6.8, leukocytosis at 12.7, positive Hemoccult. CT head shows a large right-sided acute subdural hematoma with small IPH in the right posterior temporal lobe with severe mass effect 20 mm right to left midline shift, effacement of the basilar cisterns, right uncal herniation with compression of midbrain and entrapment of the left lateral ventricle. Dr. Trenton Gammon with neurosurgery consultation for possible intervention. I had a thorough discussion with the niece and the patient's son who are both power of attorney about CODE STATUS. All questions were addressed and answered and they came to the conclusion of making her a DO NOT RESUSCITATE, DO NOT INTUBATE. They would still speak with neurosurgery about the possibility of surgery  Neurosurgery has seen the patient and spoken with the family and stated that no  surgical intervention can be provided. Family is made a decision to make the patient comfort care. She will be admitted for comfort care.    Final Clinical Impressions(s) / ED Diagnoses   Final diagnoses:  Stroke-like symptoms    New Prescriptions Current Discharge Medication List       Datra Clary Mali Hervey Wedig, MD 10-24-2016 [id number]    Orlie Dakin, MD 24-Oct-2016 1642

## 2016-10-21 NOTE — H&P (Signed)
History and Physical    CHYANNE PADDOCK [id number] DOB: [birth year] DOA: 10/21/2016  Referring MD/NP/PA:   PCP: Gerrit Heck, MD   Patient coming from:  The patient is coming from SNF.  At baseline, pt is dependent for most of ADL.   Chief Complaint: AMS and fall  HPI: Rhonda Smith is a 83 y.o. female with medical history significant of atrial fibrillation on Coumadin, CAD, myocardial infarction, dementia, complete heart block, s/p of ICD placement, sCHF, who presents with altered mental status and fall.  Per pt's Niece, pt had fall on Wednesday. She was at baseline until this afternoon at 12 noon. Pt becomes unresponsive, tachycardic, tachypnic and is clammy to touch today. No sure if she has any pain anywhere. No active cough, nausea, vomiting or diarrhea noted. Patient does not moves her extremities. Facial droop noted. When saw in ED, she is in coma and unresponsive.  ED Course: pt was found to have INR 6.8, WBC 12.7, creatinine normal, hypertensive initially, then hypotensive. CT head showed right sided large SDH with significant mass effects with 20 mm of right to left midline shift. right uncal herniation. Decision was made for comfort care after discussed with the family. Pt is admitted to med-surg bed as inpt.  Review of Systems: Could not be reviewed due to comatose status   Allergy:  Allergies  Allergen Reactions  . Sulfa Antibiotics Other (See Comments)    Unknown reaction    Past Medical History:  Diagnosis Date  . Alzheimer's disease   . Complete heart block ([place])    a. 06/2008 s/p CRT-D.  . Edema leg   . History of ischemic cardiomyopathy    a. 06/2008 s/p BSX N118 [employer] 100-D Bi-V ICD, ser # [id number];  b. 04/2013 Echo: EF 50-55%, no RWMA, Gr 2 DD, trace MR, mild to mod AI, mild Ao root dil (3.8cm @ Sinus of Valsalva);  c. 10/2013 ICD therapies turned off.  . Mild aortic insufficiency    a. 04/2013 Echo: Mild - Mod AI.  Marland Kitchen Myocardial infarction    a. 2008.  Marland Kitchen  PAF (paroxysmal atrial fibrillation) (HCC)    a. chronic coumadin;  b. 10/2013 amio discontinued.  . Right rib fracture    a. 05/2014 in setting of fall.  . Stasis dermatitis     Past Surgical History:  Procedure Laterality Date  . KNEE ARTHROSCOPY     right  . PACEMAKER INSERTION    . TOTAL SHOULDER REPLACEMENT      Social History:  reports that she has never smoked. She has never used smokeless tobacco. Her alcohol and drug histories are not on file.  Family History: Could not be reviewed due to comatose status.  Prior to Admission medications   Medication Sig Start Date End Date Taking? Authorizing Provider  acetaminophen (TYLENOL) 325 MG tablet Take 650 mg by mouth every 6 (six) hours as needed for mild pain.    Historical Provider, MD  HYDROcodone-acetaminophen (NORCO) 5-325 MG per tablet Take 1-2 tablets by mouth every 6 (six) hours as needed. Patient not taking: Reported on 10/12/2016 10/14/14   Veryl Speak, MD  HYDROcodone-acetaminophen (NORCO/VICODIN) 5-325 MG per tablet Take 1 tablet by mouth every 4 (four) hours as needed for moderate pain or severe pain. Patient not taking: Reported on 10/12/2016 10/15/14   Virgel Manifold, MD  lamoTRIgine (LAMICTAL) 25 MG tablet Take 25 mg by mouth 2 (two) times daily.    Historical Provider, MD  metoprolol succinate (TOPROL-XL) 25  MG 24 hr tablet Take 25 mg by mouth daily.    Historical Provider, MD  ondansetron (ZOFRAN ODT) 8 MG disintegrating tablet Take 1 tablet (8 mg total) by mouth every 8 (eight) hours as needed for nausea or vomiting. 10/12/16   Jola Schmidt, MD  traZODone (DESYREL) 50 MG tablet Take 100 mg by mouth at bedtime.     Historical Provider, MD  warfarin (COUMADIN) 3 MG tablet Take 3 mg by mouth daily. On Monday, Wednesday and Friday    Historical Provider, MD  warfarin (COUMADIN) 5 MG tablet Take 5 mg by mouth See admin instructions. Take 1 tab every Sun, Tues, Thurs, Sat    Historical Provider, MD    Physical Exam: Vitals:    10/14/2016 2215 10/30/2016 2230 10/28/2016 2325 Oct 25, 2016 0157  BP: (!) 98/50 103/57 (!) 108/57 120/64  Pulse: 80 80 85 77  Resp: 13 15 14 16   Temp:   (!) 96.7 F (35.9 C) 97.5 F (36.4 C)  TempSrc:   Axillary Axillary  SpO2: 97% 99% 99% 100%  Weight:   70.7 kg (155 lb 13.8 oz)   Height:   5' (1.524 m)    General: in coma HEENT:       Eyes: no scleral icterus.       ENT: No discharge from the ears and nose       Neck: No JVD, no bruit Heme: No neck lymph node enlargement. Cardiac: S1/S2, RRR, No murmurs, No gallops or rubs. Respiratory:  Has rales bilaterally. GI: Soft, nondistended, no organomegaly, BS present. GU: No hematuria Ext: No pitting leg edema bilaterally. 2+DP/PT pulse bilaterally. Musculoskeletal: No joint deformities, No joint redness or warmth, no limitation of ROM in spin. Skin: No rashes.  Neuro: pt is comatose status, unresponsive. Labs on Admission: I have personally reviewed following labs and imaging studies  CBC:  Recent Labs Lab 11/01/2016 1739 10/31/2016 1817  WBC 12.7*  --   NEUTROABS 7.5  --   HGB 13.8 14.3  HCT 40.5 42.0  MCV 95.5  --   PLT 164  --    Basic Metabolic Panel:  Recent Labs Lab 11/08/2016 1739 10/18/2016 1817  NA 131* 132*  K 4.2 4.1  CL 98* 97*  CO2 24  --   GLUCOSE 147* 155*  BUN 11 13  CREATININE 0.81 0.60  CALCIUM 8.6*  --    GFR: Estimated Creatinine Clearance: 41.8 mL/min (by C-G formula based on SCr of 0.6 mg/dL). Liver Function Tests:  Recent Labs Lab 10/12/2016 1739  AST 41  ALT 20  ALKPHOS 90  BILITOT 2.4*  PROT 6.8  ALBUMIN 2.8*   No results for input(s): LIPASE, AMYLASE in the last 168 hours. No results for input(s): AMMONIA in the last 168 hours. Coagulation Profile:  Recent Labs Lab 10/24/2016 1739  INR 6.82*   Cardiac Enzymes: No results for input(s): CKTOTAL, CKMB, CKMBINDEX, TROPONINI in the last 168 hours. BNP (last 3 results) No results for input(s): PROBNP in the last 8760 hours. HbA1C: No  results for input(s): HGBA1C in the last 72 hours. CBG: No results for input(s): GLUCAP in the last 168 hours. Lipid Profile: No results for input(s): CHOL, HDL, LDLCALC, TRIG, CHOLHDL, LDLDIRECT in the last 72 hours. Thyroid Function Tests: No results for input(s): TSH, T4TOTAL, FREET4, T3FREE, THYROIDAB in the last 72 hours. Anemia Panel: No results for input(s): VITAMINB12, FOLATE, FERRITIN, TIBC, IRON, RETICCTPCT in the last 72 hours. Urine analysis:    Component Value Date/Time  COLORURINE YELLOW (A) 10/10/2015 1350   APPEARANCEUR CLEAR (A) 10/10/2015 1350   LABSPEC 1.010 10/10/2015 1350   PHURINE 7.0 10/10/2015 1350   GLUCOSEU NEGATIVE 10/10/2015 1350   HGBUR NEGATIVE 10/10/2015 1350   BILIRUBINUR NEGATIVE 10/10/2015 1350   KETONESUR NEGATIVE 10/10/2015 1350   PROTEINUR NEGATIVE 10/10/2015 1350   UROBILINOGEN 1.0 05/12/2014 1041   NITRITE NEGATIVE 10/10/2015 1350   LEUKOCYTESUR TRACE (A) 10/10/2015 1350   Sepsis Labs: @LABRCNTIP (procalcitonin:4,lacticidven:4) )No results found for this or any previous visit (from the past 240 hour(s)).   Radiological Exams on Admission: Dg Chest Portable 1 View  Result Date: 10/19/2016 CLINICAL DATA:  Mental status changes. EXAM: PORTABLE CHEST 1 VIEW COMPARISON:  10/12/2016 FINDINGS: 1741 hours. The cardio pericardial silhouette is enlarged. Left-sided permanent pacemaker again noted. Interstitial markings are diffusely coarsened with chronic features. There is pulmonary vascular congestion without overt pulmonary edema. Left base atelectasis or infiltrate noted with probable tiny left pleural effusion. Bones are diffusely demineralized. Status post left shoulder replacement. Telemetry leads overlie the chest. IMPRESSION: Cardiomegaly with vascular congestion and left base atelectasis or infiltrate with small left pleural effusion. Electronically Signed   By: Misty Stanley M.D.   On: 10/28/2016 18:04   Ct Head Code Stroke Wo  Contrast`  Result Date: 10/15/2016 CLINICAL DATA:  Code stroke.  Stroke-like symptoms. EXAM: CT HEAD WITHOUT CONTRAST TECHNIQUE: Contiguous axial images were obtained from the base of the skull through the vertex without intravenous contrast. COMPARISON:  05/07/2014 CT head. FINDINGS: Brain: Small acute hematoma within the right posterolateral temporal lobe. Large right cerebral convexity subdural hematoma measuring up to 20 mm in thickness with blood products extending over the falx and right greater than left tentorium. Significant associated mass effect on the underlying brain with right to left mass effect at level of septum pellucidum of 15 mm, right to left subfalcine herniation, effacement of basilar cisterns, and right-sided uncal herniation. Additionally there is hydrocephalus of left lateral ventricle likely due to entrapment. No large territory infarct to the brain is identified. There microvascular ischemic changes and small lacunar infarcts within the left lentiform nucleus stable from prior CT. Vascular: Calcific atherosclerosis of cavernous internal carotid arteries. Skull: Normal. Negative for fracture or focal lesion. Sinuses/Orbits: No acute finding. Other: None. IMPRESSION: 1. Large acute right convexity subdural hematoma and small parenchymal hemorrhage within the right posterior temporal lobe. 2. Severe mass effect with 20 mm right-to-left midline shift, effacement of basilar cisterns, right uncal herniation with compression of midbrain, and entrapped left lateral ventricle. These results were called by telephone at the time of interpretation on 11/05/2016 at 6:30 pm to Dr.Taqeer, who verbally acknowledged these results. Electronically Signed   By: Kristine Garbe M.D.   On: 10/12/2016 18:32     EKG: Independently reviewed.  Pacer rhythm, QTC 558  Assessment/Plan Principal Problem:   SDH (subdural hematoma) (HCC) Active Problems:   Atrial fibrillation (HCC)   Chronic  systolic heart failure (HCC)   Complete heart block (HCC)   ICD (implantable cardioverter-defibrillator), biventricular, in situ   Dementia   Fall   Acute encephalopathy  SDH (subdural hematoma): CT head showed right sided large SDH with significant mass effects with 20 mm of right to left midline shift. right uncal herniation. Neurosurgery, Dr. Annette Stable and neurology, Dr.Toqee were consulted. Prognosis is extremely poor. Decision was made for comfort care after discussed with the family.  -Will admit to regular bed for comfort care. -Stop drawing labs and IVF -LORazepam 2 MG prn q4h  SL for agitation -morphine 20 MG/ML concentrated solution (ROXANOL ): 5 mg prn SL q2h for pain or shortness of breath.  -Naphazoline 0.1 % ophthalmic solution:  Prn q6h for eye irritation -scopolamine 1.5 MG patch (1.5 mg total) onto the skin every 3 (three)  -Psycho/Social: emotional support offered to patient and family at bedside -consult to palliterative care team in AM   Other active Problems as below: hold all home medication, comfort care only as above    Atrial fibrillation ([place])   Chronic systolic heart failure (HCC)   Complete heart block ([place])   ICD (implantable cardioverter-defibrillator), biventricular, in situ   Dementia   DVT ppx: none Code Status: DNR Family Communication:  Yes, patient's niece at bed side Disposition Plan:  Anticipate death in hospital Consults called:  Neurosurgery, Dr. Annette Stable and neurology, [place] Admission status: medical floor/inpt  Date of Service 10/30/2016    Ivor Costa [facility] Pager [phone]  If 7PM-7AM, please contact night-coverage [email or web address] Password [facility] 10/30/2016, 4:19 AM

## 2016-10-21 NOTE — ED Notes (Signed)
Pt resting.

## 2016-10-21 NOTE — ED Notes (Signed)
Report called  To 5 m

## 2016-10-21 NOTE — ED Provider Notes (Signed)
Level V caveat altered mental status. Patient presents from assisted living facility unresponsive reportedly since today. On exam she is chronically ill-appearing., Grimaces to noxious stimulus. HEENT exam no facial asymmetry. Eyes left pupil 2 mm nonreactive, right pupil 4 mm, nonreactive. Gag reflex intact neck supple lungs scant rhonchi diffusely heart regular rate and rhythm abdomen obese, nontender. Left left lower extremity diffusely ecchymotic below the knee. All other extremities or contusion abrasion or tenderness neurovascular intact ED ECG REPORT   Date: 10/29/2016  Rate: 115  Rhythm: Electronically paced  QRS Axis: left  Intervals: normal  ST/T Wave abnormalities: nonspecific T wave changes  Conduction Disutrbances:left bundle branch block  Narrative Interpretation:   Old EKG Reviewed: Unchanged from 07/10/2014  I have personally reviewed the EKG tracing and agree with the computerized printout as noted.  CT scan reviewed. I spoke with patient's niece and healthcare power of attorney. She is aware of subdural hematoma. She designated patient to be a DO NOT RESUSCITATE CODE STATUS. CT scan reviewed by me. We have consulted neurosurgery and Dr.Pool is indicated he'll come to emergency department to speak with family. Patient's condition is moribund. His niece is aware and wishes for comfort measures only. Patient to be admitted to hospitalist service Results for orders placed or performed during the hospital encounter of 11/01/2016  Ethanol  Result Value Ref Range   Alcohol, Ethyl (B) <5 <5 mg/dL  Protime-INR  Result Value Ref Range   Prothrombin Time 61.3 (H) 11.4 - 15.2 seconds   INR 6.82 (HH)   APTT  Result Value Ref Range   aPTT 54 (H) 24 - 36 seconds  CBC  Result Value Ref Range   WBC 12.7 (H) 4.0 - 10.5 K/uL   RBC 4.24 3.87 - 5.11 MIL/uL   Hemoglobin 13.8 12.0 - 15.0 g/dL   HCT 40.5 36.0 - 46.0 %   MCV 95.5 78.0 - 100.0 fL   MCH 32.5 26.0 - 34.0 pg   MCHC 34.1 30.0 -  36.0 g/dL   RDW 15.4 11.5 - 15.5 %   Platelets 164 150 - 400 K/uL  Differential  Result Value Ref Range   Neutrophils Relative % 59 %   Neutro Abs 7.5 1.7 - 7.7 K/uL   Lymphocytes Relative 25 %   Lymphs Abs 3.2 0.7 - 4.0 K/uL   Monocytes Relative 14 %   Monocytes Absolute 1.8 (H) 0.1 - 1.0 K/uL   Eosinophils Relative 1 %   Eosinophils Absolute 0.1 0.0 - 0.7 K/uL   Basophils Relative 1 %   Basophils Absolute 0.1 0.0 - 0.1 K/uL  Comprehensive metabolic panel  Result Value Ref Range   Sodium 131 (L) 135 - 145 mmol/L   Potassium 4.2 3.5 - 5.1 mmol/L   Chloride 98 (L) 101 - 111 mmol/L   CO2 24 22 - 32 mmol/L   Glucose, Bld 147 (H) 65 - 99 mg/dL   BUN 11 6 - 20 mg/dL   Creatinine, Ser 0.81 0.44 - 1.00 mg/dL   Calcium 8.6 (L) 8.9 - 10.3 mg/dL   Total Protein 6.8 6.5 - 8.1 g/dL   Albumin 2.8 (L) 3.5 - 5.0 g/dL   AST 41 15 - 41 U/L   ALT 20 14 - 54 U/L   Alkaline Phosphatase 90 38 - 126 U/L   Total Bilirubin 2.4 (H) 0.3 - 1.2 mg/dL   GFR calc non Af Amer >60 >60 mL/min   GFR calc Af Amer >60 >60 mL/min   Anion gap  9 5 - 15  I-Stat Venous Blood Gas, ED (order at [facility] and [facility] only)  Result Value Ref Range   pH, Ven 7.398 7.250 - 7.430   pCO2, Ven 37.4 (L) 44.0 - 60.0 mmHg   pO2, Ven 53.0 (H) 32.0 - 45.0 mmHg   Bicarbonate 23.1 20.0 - 28.0 mmol/L   TCO2 24 0 - 100 mmol/L   O2 Saturation 87.0 %   Acid-base deficit 1.0 0.0 - 2.0 mmol/L   Patient temperature HIDE    Sample type VENOUS   POC occult blood, ED  Result Value Ref Range   Fecal Occult Bld POSITIVE (A) NEGATIVE  I-Stat Chem 8, ED  (not at [facility], [facility])  Result Value Ref Range   Sodium 132 (L) 135 - 145 mmol/L   Potassium 4.1 3.5 - 5.1 mmol/L   Chloride 97 (L) 101 - 111 mmol/L   BUN 13 6 - 20 mg/dL   Creatinine, Ser 0.60 0.44 - 1.00 mg/dL   Glucose, Bld 155 (H) 65 - 99 mg/dL   Calcium, Ion 1.06 (L) 1.15 - 1.40 mmol/L   TCO2 23 0 - 100 mmol/L   Hemoglobin 14.3 12.0 - 15.0 g/dL   HCT 42.0 36.0 - 46.0 %  I-stat troponin,  ED (not at [facility], [facility])  Result Value Ref Range   Troponin i, poc 0.05 0.00 - 0.08 ng/mL   Comment 3           Dg Chest 2 View  Result Date: 10/12/2016 CLINICAL DATA:  Nausea and vomiting today.  Low-grade fever. EXAM: CHEST  2 VIEW COMPARISON:  05/10/2014 FINDINGS: Mild vascular and interstitial prominence, new. Small pleural effusions bilaterally. No dense confluent consolidation. Moderate cardiomegaly. Intact appearances of the transvenous cardiac leads. IMPRESSION: Vascular and interstitial changes suggest mild congestive heart failure. There also or small pleural effusions. No focal dense consolidation Electronically Signed   By: Andreas Newport M.D.   On: 10/12/2016 22:44   Dg Chest Portable 1 View  Result Date: 10/30/2016 CLINICAL DATA:  Mental status changes. EXAM: PORTABLE CHEST 1 VIEW COMPARISON:  10/12/2016 FINDINGS: 1741 hours. The cardio pericardial silhouette is enlarged. Left-sided permanent pacemaker again noted. Interstitial markings are diffusely coarsened with chronic features. There is pulmonary vascular congestion without overt pulmonary edema. Left base atelectasis or infiltrate noted with probable tiny left pleural effusion. Bones are diffusely demineralized. Status post left shoulder replacement. Telemetry leads overlie the chest. IMPRESSION: Cardiomegaly with vascular congestion and left base atelectasis or infiltrate with small left pleural effusion. Electronically Signed   By: Misty Stanley M.D.   On: 10/26/2016 18:04   Dg Abd 2 Views  Result Date: 10/12/2016 CLINICAL DATA:  Nausea today. EXAM: ABDOMEN - 2 VIEW COMPARISON:  None. FINDINGS: The bowel gas pattern is normal. There is no evidence of free air. No radio-opaque calculi or other significant radiographic abnormality is seen. IMPRESSION: Negative. Electronically Signed   By: Andreas Newport M.D.   On: 10/12/2016 22:45   Ct Head Code Stroke Wo Contrast`  Result Date: 10/16/2016 CLINICAL DATA:  Code stroke.   Stroke-like symptoms. EXAM: CT HEAD WITHOUT CONTRAST TECHNIQUE: Contiguous axial images were obtained from the base of the skull through the vertex without intravenous contrast. COMPARISON:  05/07/2014 CT head. FINDINGS: Brain: Small acute hematoma within the right posterolateral temporal lobe. Large right cerebral convexity subdural hematoma measuring up to 20 mm in thickness with blood products extending over the falx and right greater than left tentorium. Significant associated mass effect on  the underlying brain with right to left mass effect at level of septum pellucidum of 15 mm, right to left subfalcine herniation, effacement of basilar cisterns, and right-sided uncal herniation. Additionally there is hydrocephalus of left lateral ventricle likely due to entrapment. No large territory infarct to the brain is identified. There microvascular ischemic changes and small lacunar infarcts within the left lentiform nucleus stable from prior CT. Vascular: Calcific atherosclerosis of cavernous internal carotid arteries. Skull: Normal. Negative for fracture or focal lesion. Sinuses/Orbits: No acute finding. Other: None. IMPRESSION: 1. Large acute right convexity subdural hematoma and small parenchymal hemorrhage within the right posterior temporal lobe. 2. Severe mass effect with 20 mm right-to-left midline shift, effacement of basilar cisterns, right uncal herniation with compression of midbrain, and entrapped left lateral ventricle. These results were called by telephone at the time of interpretation on 10/29/2016 at 6:30 pm to Dr.Taqeer, who verbally acknowledged these results. Electronically Signed   By: Kristine Garbe M.D.   On: 11/02/2016 18:32  CRITICAL CARE Performed by: Orlie Dakin Total critical care time: 30 minutes Critical care time was exclusive of separately billable procedures and treating other patients. Critical care was necessary to treat or prevent imminent or life-threatening  deterioration. Critical care was time spent personally by me on the following activities: development of treatment plan with patient and/or surrogate as well as nursing, discussions with consultants, evaluation of patient's response to treatment, examination of patient, obtaining history from patient or surrogate, ordering and performing treatments and interventions, ordering and review of laboratory studies, ordering and review of radiographic studies, pulse oximetry and re-evaluation of patient's condition.   Orlie Dakin, MD 10/25/2016 [phone]

## 2016-10-21 NOTE — ED Notes (Signed)
Mannitol infusing at this time at 366ml/hr.  Pt's niece at bedside.   Drl. Page at bedside talking to pt's son Shanon Brow) via telephone discussing DNR at this time

## 2016-10-21 NOTE — Code Documentation (Signed)
83 year old female presents to [facility] via [place] with altered mental status.  Code stroke was called in the ED.  Stroke team in ED - on our assessment patient was responsive only to pain - she has withdrawal of right side to pain - no movement on left.  Right pupil is 30mm left pupil 81mm very sluggish reaction.  Resps were labored - cyanotic.  Placed back on NRB mask.  Left leg with large hematoma present.  Non-verbal.  Not following commands.  Niece who has co-HCPOA with son is present at the bedside - son is in [place] and is aware of situation.  Niece reports she was with patient until 1200 today and the patient was at her baseline - awake and talking.  She reports patient had a fall several days ago which she had been checked out and nothing was abnormal.  Patient is on Coumadin.  She has a pacemaker but niece reports the patient had the defibrillator turned off about a year ago.  She was taken to CT scan which revealed a large subdural hematoma with a large shift.  She was taken back to the ED - [facility] was elevated - mannitol was started per order neuro MD.  Neurosurgery was consulted by EDP.  Conversation with niece and son via phone regarding findings was done with neuro MD Dr. Alver Fisher and EDP doctor - they both agreed for patient to be a DNR.  Dr. Trenton Gammon to bedside - discussed case with niece - decision to make patient comfort care.  HR 165 RR 28 and labored - BP 128/64 - Morphine 2 mg IV given per order.  Patient resting quietly - 102/69 HR 115 RR 19 O2 sats 100% on NRB mask.  Support to niece by myself and the chaplain.  Niece is relieved patient is comfortable.  Handoff to [employer] RN's in ED.

## 2016-10-21 NOTE — ED Notes (Signed)
Return from CT.  Pt's niece at bedside.

## 2016-10-21 NOTE — Progress Notes (Signed)
   10/14/2016 1900  Clinical Encounter Type  Visited With Patient and family together  Visit Type ED;Patient actively dying;Trauma  Referral From Nurse  Consult/Referral To Chaplain  Spiritual Encounters  Spiritual Needs Sacred text;Prayer;Emotional;Grief support  Stress Factors  Patient Stress Factors Other (Comment) (actively dying)  Pt. Actively dying at the moment, Niece is at bedside,already accepting situation.  With the permission of the family, Chaplain prayed with both Niece and pt. ,reading of Scripture from Psalm 23, hymnal, blessings of peace and comfort, ministry of presences, emotional and spiritual support.  Advise to page Chaplain if further needed.  Chaplain Ama Mcmaster A. Daryle Amis, MA-PC , BA-REL/PHIL, CPE III   [phone]

## 2016-10-21 NOTE — Consult Note (Addendum)
Neurology Consultation Reason for Consult: Unresponsive Referring Physician: ED  CC: Code Stroke  History is obtained from: Niece  HPI: Rhonda Smith is a 83 y.o. female presented from facility after unresponsive, tachycardic, and tachyponic. Code stroke was activated for left sided weakness. On my evaluation patient was noted to have anisocoria. Pt's niece reported patient had a fall on Wednesday however she was at baseline until this afternoon at 12noon.  LKW: 12noon tpa given?: no, pt with subdural hemorrhage.  ROS: Unable to obtain due to altered mental status.   Past Medical History:  Diagnosis Date  . Alzheimer's disease   . Complete heart block ([place])    a. 06/2008 s/p CRT-D.  . Edema leg   . History of ischemic cardiomyopathy    a. 06/2008 s/p BSX N118 Cognis 100-D Bi-V ICD, ser # [id number];  b. 04/2013 Echo: EF 50-55%, no RWMA, Gr 2 DD, trace MR, mild to mod AI, mild Ao root dil (3.8cm @ Sinus of Valsalva);  c. 10/2013 ICD therapies turned off.  . Mild aortic insufficiency    a. 04/2013 Echo: Mild - Mod AI.  Marland Kitchen Myocardial infarction    a. 2008.  Marland Kitchen PAF (paroxysmal atrial fibrillation) (HCC)    a. chronic coumadin;  b. 10/2013 amio discontinued.  . Right rib fracture    a. 05/2014 in setting of fall.  . Stasis dermatitis    No family history on file.  Social History:  reports that she has never smoked. She has never used smokeless tobacco. Her alcohol and drug histories are not on file.  Exam: Current vital signs: BP 101/83   Pulse (!) 124   Resp 21   Wt 71.7 kg (158 lb)   SpO2 100%   BMI 27.99 kg/m  Vital signs in last 24 hours: Pulse Rate:  [110-124] 124 (01/12 1815) Resp:  [19-26] 21 (01/12 1815) BP: (101-175)/(78-93) 101/83 (01/12 1815) SpO2:  [94 %-100 %] 100 % (01/12 1815) Weight:  [66.2 kg (146 lb)-71.7 kg (158 lb)] 71.7 kg (158 lb) (01/12 1824)  Physical Exam  Constitutional: On non re breather.  Neuro: Mental Status: Unresponsive. Cranial Nerves: II:  Visual Fields are full. Right pupil 87mm and dilated. Left pupils 44mm and not reactive. III,IV, VI: Eyes midline. No movement with oculocephalic. V: Blinks to corneal reflex. VII: Does blink to corneal reflex. VIII: Unable to test due to poor cooperation. X: Unable to test due to not following commands. XI: Shoulder shrug is symmetric. XII: tongue is midline without atrophy or fasciculations.  Motor: Withdrawal right side somewhat. No withdrawal in left side. Sensory: Unable to test due to AMS Deep Tendon Reflexes: 2+ and symmetric in the biceps and patellae. Plantars: Toes are upgoing bilaterally Cerebellar: Unable to test.  I have reviewed the images obtained:CT head was reviewed and it shows right sided large SDH with right ventical entrapement. Right uncal herniation.  Impression:  Large right sided SDH with uncal herniation. Likely etiology is fall with supratherapeutic INR.  Recommendations: -Mannitol 1gm/kg to assist with elevated ICP although this is less likely to help at this time. -Neurosurgery consultation -Reversal of INR with Vit K and [place] -Spoke with niece who is the only family members this patient has and she reported that patient would not want to pursue any aggressive measure. She had her pacemaker turned off about a year ago.  Neurosurgery to discuss options with family.

## 2016-10-22 DIAGNOSIS — I6200 Nontraumatic subdural hemorrhage, unspecified: Secondary | ICD-10-CM

## 2016-10-22 DIAGNOSIS — Z515 Encounter for palliative care: Secondary | ICD-10-CM

## 2016-10-22 MED ORDER — MORPHINE SULFATE (PF) 2 MG/ML IV SOLN: 1.0000 mg | INTRAVENOUS | Status: DC | PRN

## 2016-10-22 MED ORDER — LORAZEPAM 2 MG/ML IJ SOLN: 1.0000 mg | INTRAMUSCULAR | Status: DC | PRN

## 2016-10-22 MED ORDER — GLYCOPYRROLATE 0.2 MG/ML IJ SOLN
0.2000 mg | INTRAMUSCULAR | Status: DC
  Administered 2016-10-22: 0.2 mg via INTRAVENOUS
  Filled 2016-10-22: qty 1

## 2016-10-22 MED ORDER — MORPHINE SULFATE (CONCENTRATE) 10 MG/0.5ML PO SOLN
5.0000 mg | ORAL | Status: DC | PRN
  Administered 2016-10-22: 5 mg via SUBLINGUAL
  Filled 2016-10-22: qty 0.5

## 2016-10-22 MED ORDER — MORPHINE SULFATE (CONCENTRATE) 10 MG/0.5ML PO SOLN
10.0000 mg | ORAL | Status: DC | PRN
  Administered 2016-10-22: 10 mg via SUBLINGUAL
  Filled 2016-10-22: qty 0.5

## 2016-10-23 DIAGNOSIS — Z515 Encounter for palliative care: Secondary | ICD-10-CM

## 2016-11-08 DIAGNOSIS — R609 Edema, unspecified: Secondary | ICD-10-CM | POA: Diagnosis not present

## 2016-11-08 DIAGNOSIS — I4891 Unspecified atrial fibrillation: Secondary | ICD-10-CM | POA: Diagnosis not present

## 2016-11-08 DIAGNOSIS — F0391 Unspecified dementia with behavioral disturbance: Secondary | ICD-10-CM | POA: Diagnosis not present

## 2016-11-08 DIAGNOSIS — G4700 Insomnia, unspecified: Secondary | ICD-10-CM | POA: Diagnosis not present

## 2016-11-08 DIAGNOSIS — I252 Old myocardial infarction: Secondary | ICD-10-CM | POA: Diagnosis not present

## 2016-11-08 DIAGNOSIS — Z7901 Long term (current) use of anticoagulants: Secondary | ICD-10-CM | POA: Diagnosis not present

## 2016-11-10 NOTE — Progress Notes (Signed)
PROGRESS NOTE [facility]   Rhonda Smith   [id number] DOB: 1925-09-30  DOA: 10/29/2016 PCP: Gerrit Heck, MD   Brief Narrative: Rhonda Smith is a 83 y.o. female with medical history significant of atrial fibrillation on Coumadin, CAD, myocardial infarction, dementia, complete heart block, s/p of ICD placement, sCHF, was brought to the ED with altered mental status and fall 3 days PTA. Patient was found unresponsive by her niece. IN ED was found to have an INR of 6.8, CT head showed a R side large SDH with significant mass effect and r uncal herniation. Neurology and Neurosurgery were consulted and given poor prognosis and family made decision of comfort measurers. Patient admitted for comfort care management.   Subjective: Unresponsive  Assessment & Plan: Subdural Hematoma - Comfort care  Afib  Chronic systolic CHF Complete heart block   DVT prophylaxis: Comfort care Code Status: DNR Family Communication:  Disposition Plan: Anticipate hospital demise    Objective: Vitals:   10/16/2016 2230 11/06/2016 2325 09-Nov-2016 0157 2016/11/09 0535  BP: 103/57 (!) 108/57 120/64 (!) 92/44  Pulse: 80 85 77 74  Resp: 15 14 16 16   Temp:  (!) 96.7 F (35.9 C) 97.5 F (36.4 C) (!) 96 F (35.6 C)  TempSrc:  Axillary Axillary Axillary  SpO2: 99% 99% 100% 99%  Weight:  70.7 kg (155 lb 13.8 oz)    Height:  5' (1.524 m)     No intake or output data in the 24 hours ending 2016/11/09 1158 Filed Weights   10/23/2016 1731 10/11/2016 1824 11/05/2016 2325  Weight: 66.2 kg (146 lb) 71.7 kg (158 lb) 70.7 kg (155 lb 13.8 oz)    Examination:  Unresponsive    Data Reviewed: I have personally reviewed following labs and imaging studies  CBC:  Recent Labs Lab 11/01/2016 1739 10/12/2016 1817  WBC 12.7*  --   NEUTROABS 7.5  --   HGB 13.8 14.3  HCT 40.5 42.0  MCV 95.5  --   PLT 164  --    Basic Metabolic Panel:  Recent Labs Lab 10/14/2016 1739 10/25/2016 1817  NA 131* 132*  K 4.2 4.1    CL 98* 97*  CO2 24  --   GLUCOSE 147* 155*  BUN 11 13  CREATININE 0.81 0.60  CALCIUM 8.6*  --    GFR: Estimated Creatinine Clearance: 41.8 mL/min (by C-G formula based on SCr of 0.6 mg/dL). Liver Function Tests:  Recent Labs Lab 10/29/2016 1739  AST 41  ALT 20  ALKPHOS 90  BILITOT 2.4*  PROT 6.8  ALBUMIN 2.8*   No results for input(s): LIPASE, AMYLASE in the last 168 hours. No results for input(s): AMMONIA in the last 168 hours. Coagulation Profile:  Recent Labs Lab 10/21/16 1739  INR 6.82*   Cardiac Enzymes: No results for input(s): CKTOTAL, CKMB, CKMBINDEX, TROPONINI in the last 168 hours. BNP (last 3 results) No results for input(s): PROBNP in the last 8760 hours. HbA1C: No results for input(s): HGBA1C in the last 72 hours. CBG: No results for input(s): GLUCAP in the last 168 hours. Lipid Profile: No results for input(s): CHOL, HDL, LDLCALC, TRIG, CHOLHDL, LDLDIRECT in the last 72 hours. Thyroid Function Tests: No results for input(s): TSH, T4TOTAL, FREET4, T3FREE, THYROIDAB in the last 72 hours. Anemia Panel: No results for input(s): VITAMINB12, FOLATE, FERRITIN, TIBC, IRON, RETICCTPCT in the last 72 hours. Sepsis Labs: No results for input(s): PROCALCITON, LATICACIDVEN in the last 168 hours.  No results found for this or  any previous visit (from the past 240 hour(s)).    Radiology Studies: Dg Chest Portable 1 View  Result Date: 11/03/2016 CLINICAL DATA:  Mental status changes. EXAM: PORTABLE CHEST 1 VIEW COMPARISON:  10/12/2016 FINDINGS: 1741 hours. The cardio pericardial silhouette is enlarged. Left-sided permanent pacemaker again noted. Interstitial markings are diffusely coarsened with chronic features. There is pulmonary vascular congestion without overt pulmonary edema. Left base atelectasis or infiltrate noted with probable tiny left pleural effusion. Bones are diffusely demineralized. Status post left shoulder replacement. Telemetry leads overlie the  chest. IMPRESSION: Cardiomegaly with vascular congestion and left base atelectasis or infiltrate with small left pleural effusion. Electronically Signed   By: Misty Stanley M.D.   On: 10/15/2016 18:04   Ct Head Code Stroke Wo Contrast`  Result Date: 10/20/2016 CLINICAL DATA:  Code stroke.  Stroke-like symptoms. EXAM: CT HEAD WITHOUT CONTRAST TECHNIQUE: Contiguous axial images were obtained from the base of the skull through the vertex without intravenous contrast. COMPARISON:  05/07/2014 CT head. FINDINGS: Brain: Small acute hematoma within the right posterolateral temporal lobe. Large right cerebral convexity subdural hematoma measuring up to 20 mm in thickness with blood products extending over the falx and right greater than left tentorium. Significant associated mass effect on the underlying brain with right to left mass effect at level of septum pellucidum of 15 mm, right to left subfalcine herniation, effacement of basilar cisterns, and right-sided uncal herniation. Additionally there is hydrocephalus of left lateral ventricle likely due to entrapment. No large territory infarct to the brain is identified. There microvascular ischemic changes and small lacunar infarcts within the left lentiform nucleus stable from prior CT. Vascular: Calcific atherosclerosis of cavernous internal carotid arteries. Skull: Normal. Negative for fracture or focal lesion. Sinuses/Orbits: No acute finding. Other: None. IMPRESSION: 1. Large acute right convexity subdural hematoma and small parenchymal hemorrhage within the right posterior temporal lobe. 2. Severe mass effect with 20 mm right-to-left midline shift, effacement of basilar cisterns, right uncal herniation with compression of midbrain, and entrapped left lateral ventricle. These results were called by telephone at the time of interpretation on 11/05/2016 at 6:30 pm to Dr.Taqeer, who verbally acknowledged these results. Electronically Signed   By: Kristine Garbe M.D.   On: 10/21/2016 18:32    Scheduled Meds: . scopolamine  1 patch Transdermal Q72H   Continuous Infusions:   LOS: 1 day    Chipper Oman, MD [facility] Pager [phone]  If 7PM-7AM, please contact night-coverage [email or web address] Password [facility] November 17, 2016, 11:58 AM

## 2016-11-10 NOTE — Consult Note (Signed)
Consultation Note Date: November 09, 2016  Patient Name: Rhonda Smith  DOB: 29-Jun-1925  MRN: [id number]  Age / Sex: 83 y.o., female  PCP: Leighton Ruff, MD Referring Physician: No att. providers found  Reason for Consultation: Establishing goals of care  HPI/Patient Profile: 83 y.o. female  with past medical history of paroxysmal atrial fibrillation, myocardial infarction, aortic insufficiency, ischemic cardiomyopathy, complete heart block s/p ICD, CHF and Alzheimers admitted on 11/04/2016 with AMS and fall. Patient became unresponsive, tachycardic and with tachypnea. Facial droop noted. In ED, patient found to have INR 6.8, WBC 12.7, and CT head revealing right sided large SDH with right to left midline shift. Decision made by family for comfort care. Palliative medicine consulted.   Clinical Assessment and Goals of Care: I have reviewed medical records, spoken with RN, assessed the patient and met with niece and son at bedside to discuss diagnosis, poor prognosis, GOC, and EOL wishes. Introduced palliative medicine. Niece and son speak of how amazing of a woman she has been and "lived a good life." Baseline pleasantly confused but was living at an assisted living facility and fairly independent prior to the fall. Family understands poor prognosis and want her to be comfortable.   Discussed comfort measures and symptom management medications I have added to her MAR. Discussed the expectations at EOL and transition off of non-rebreather mask to a nasal cannula, explaining this may be prolonging the dying process. Family does not want her to suffer and agreeable to transition to nasal cannula. RN at bedside giving Roxanol and instructed to place on 4L after medication has taken effect.   Family speaks of the patient being a very spiritual person. Chaplain visited when she was in the ER but family aware that spiritual care is  available any time. Questions and concerns addressed. PMT will continue to support holistically.    SUMMARY OF RECOMMENDATIONS    DNR/DNI  Comfort measures only.   Symptom management--see below.   RN to transition from NRB mask to nasal cannula.  Chaplain services offered.   EOL expectations discussed with family.   Anticipate hospital death.   Code Status/Advance Care Planning:  DNR   Symptom Management:   Scheduled robinul IV q4h for copious secretions.   Roxanol 6m SL q1h prn pain/dyspnea/air hunger.  May give Morphine 127mIV q1h prn pain/dyspnea/air hunger  Ativan 86m29mV q4h prn anxiety  Palliative Prophylaxis:   Aspiration, Delirium Protocol, Frequent Pain Assessment, Oral Care and Turn Reposition  Additional Recommendations (Limitations, Scope, Preferences):  Full Comfort Care  Psycho-social/Spiritual:   Desire for further Chaplaincy support:yes  Additional Recommendations: Caregiving  Support/Resources  Prognosis:   Hours - Days  Discharge Planning: Anticipated Hospital Death      Primary Diagnoses: Present on Admission: . SDH (subdural hematoma) ([facility] Atrial fibrillation ([facility] Chronic systolic heart failure ([facility] Complete heart block ([facility] Dementia . Fall . ICD (implantable cardioverter-defibrillator), biventricular, in situ . Acute encephalopathy   I have reviewed the medical record, interviewed the patient and  family, and examined the patient. The following aspects are pertinent.  Past Medical History:  Diagnosis Date  . Alzheimer's disease   . Complete heart block ([place])    a. 06/2008 s/p CRT-D.  . Edema leg   . History of ischemic cardiomyopathy    a. 06/2008 s/p BSX N118 Cognis 100-D Bi-V ICD, ser # [id number];  b. 04/2013 Echo: EF 50-55%, no RWMA, Gr 2 DD, trace MR, mild to mod AI, mild Ao root dil (3.8cm @ Sinus of Valsalva);  c. 10/2013 ICD therapies turned off.  . Mild aortic insufficiency    a. 04/2013 Echo: Mild - Mod AI.   Marland Kitchen Myocardial infarction    a. 2008.  Marland Kitchen PAF (paroxysmal atrial fibrillation) (HCC)    a. chronic coumadin;  b. 10/2013 amio discontinued.  . Right rib fracture    a. 05/2014 in setting of fall.  . Stasis dermatitis    Social History   Social History  . Marital status: Single    Spouse name: N/A  . Number of children: N/A  . Years of education: N/A   Social History Main Topics  . Smoking status: Never Smoker  . Smokeless tobacco: Never Used  . Alcohol use None  . Drug use: Unknown  . Sexual activity: Not Asked   Other Topics Concern  . None   Social History Narrative  . None   History reviewed. No pertinent family history. Scheduled Meds:  Continuous Infusions: PRN Meds:. Medications Prior to Admission:  Prior to Admission medications   Medication Sig Start Date End Date Taking? Authorizing Provider  acetaminophen (TYLENOL) 325 MG tablet Take 650 mg by mouth every 6 (six) hours as needed for mild pain.    Historical Provider, MD  HYDROcodone-acetaminophen (NORCO) 5-325 MG per tablet Take 1-2 tablets by mouth every 6 (six) hours as needed. Patient not taking: Reported on 10/12/2016 10/14/14   Veryl Speak, MD  HYDROcodone-acetaminophen (NORCO/VICODIN) 5-325 MG per tablet Take 1 tablet by mouth every 4 (four) hours as needed for moderate pain or severe pain. Patient not taking: Reported on 10/12/2016 10/15/14   Virgel Manifold, MD  lamoTRIgine (LAMICTAL) 25 MG tablet Take 25 mg by mouth 2 (two) times daily.    Historical Provider, MD  metoprolol succinate (TOPROL-XL) 25 MG 24 hr tablet Take 25 mg by mouth daily.    Historical Provider, MD  ondansetron (ZOFRAN ODT) 8 MG disintegrating tablet Take 1 tablet (8 mg total) by mouth every 8 (eight) hours as needed for nausea or vomiting. 10/12/16   Jola Schmidt, MD  traZODone (DESYREL) 50 MG tablet Take 100 mg by mouth at bedtime.     Historical Provider, MD  warfarin (COUMADIN) 3 MG tablet Take 3 mg by mouth daily. On Monday, Wednesday and  Friday    Historical Provider, MD  warfarin (COUMADIN) 5 MG tablet Take 5 mg by mouth See admin instructions. Take 1 tab every Sun, Tues, Thurs, Sat    Historical Provider, MD   Allergies  Allergen Reactions  . Sulfa Antibiotics Other (See Comments)    Unknown reaction   Review of Systems  Unable to perform ROS: Acuity of condition   Physical Exam  Constitutional: She appears lethargic.  Cardiovascular: An irregularly irregular rhythm present. Exam reveals distant heart sounds.   Pulmonary/Chest: No accessory muscle usage. No respiratory distress. She has rhonchi.  Shallow, copious secretions  Abdominal: Normal appearance. There is no tenderness.  Neurological: She appears lethargic.  Skin: Skin is warm.  BLE mottled  Nursing note and vitals reviewed.  Vital Signs: BP (!) 103/49 (BP Location: Left Arm)   Pulse 80   Temp (!) 96 F (35.6 C) (Axillary)   Resp 16   Ht 5' (1.524 m)   Wt 70.7 kg (155 lb 13.8 oz)   SpO2 97%   BMI 30.44 kg/m  Pain Assessment: PAINAD      SpO2: SpO2: 97 % O2 Device:SpO2: 97 % O2 Flow Rate: .O2 Flow Rate (L/min): 7 L/min  IO: Intake/output summary: No intake or output data in the 24 hours ending 10/23/16 0822  LBM: Last BM Date:  (PTA) Baseline Weight: Weight: 66.2 kg (146 lb) Most recent weight: Weight: 70.7 kg (155 lb 13.8 oz)     Palliative Assessment/Data: PPS 10%   Flowsheet Rows   Flowsheet Row Most Recent Value  Intake Tab  Referral Department  Hospitalist  Unit at Time of Referral  Med/Surg Unit  Palliative Care Primary Diagnosis  Neurology  Palliative Care Type  New Palliative care  Reason for referral  End of Life Care Assistance  Date of Admission  10/20/2016  Date first seen by Palliative Care  2016/10/24  Clinical Assessment  Palliative Performance Scale Score  10%  Psychosocial & Spiritual Assessment  Palliative Care Outcomes  Patient/Family meeting held?  Yes  Who was at the meeting?  son and niece  Palliative Care  Outcomes  Improved pain interventions, Improved non-pain symptom therapy, Clarified goals of care, Provided end of life care assistance, Provided psychosocial or spiritual support  Patient/Family wishes: Interventions discontinued/not started   Other (Comment) [NRB mask ]      Time In: 1530 Time Out: 1630 Time Total: 42mn Greater than 50%  of this time was spent counseling and coordinating care related to the above assessment and plan.  Signed by:  MIhor Dow FNP-C Palliative Medicine Team  Phone: [phone]: [phone]  Please contact Palliative Medicine Team phone at [phone] questions and concerns.  For individual provider: See AShea Evans

## 2016-11-10 NOTE — Progress Notes (Signed)
Pt arrived from ED around 2330 unresponsive with 7 litre's of O2 with re breather mask family at bed side, report from ED indicated comfort measures for patient. Will continue to monitor patient and support family.

## 2016-11-10 NOTE — Progress Notes (Signed)
Time of death pronounced at 58 by Therese Sarah RN and A. Delena Bali. Family present. MD notifed. ME notified. ME stated that pt would be a ME case. ME also stated she would be the one to sign the death certificate.

## 2016-11-10 NOTE — Progress Notes (Signed)
Palliative Medicine Team  This NP has reviewed medical records and met with son and niece at bedside. Patient is actively dying. Family understands severity of SDH, poor prognosis, and comfort measures only. Educated on symptom management and expectations at EOL. Patient currently on non-rebreather mask. Discussed with family that this may be prolonging the dying process and if they are comfortable, we will transition her from this mask to nasal cannula. They understand and agree with this. Discussed with RN to give prn Roxanol and transition patient to nasal cannula. I have adjusted prn medication regimen to ensure comfort and added scheduled robinul for copious secretions. Offered support to family and chaplain services that are always available. Encouraged RN to call me with questions or concerns.   Full note to follow.   NO CHARGE  Ihor Dow, FNP-C Palliative Medicine Team  Phone: [phone] Fax: [phone]

## 2016-11-10 NOTE — Discharge Summary (Signed)
Death Summary  Rhonda Smith [id number] DOB: January 16, 1925 DOA: 10-23-16  PCP: Gerrit Heck, MD  Admit date: October 23, 2016 Date of Death: 24-Oct-2016 Time of Death: 17:08 Notification: Gerrit Heck, MD notified of death of 2016-10-24   History of present illness:  Rhonda Smith is a 83 y.o. female with a history of Afib on Warfarin, CAD, dementia complete heart block, s/p ICD and CHF. Rhonda Smith presented with complaint of AMS 3 days after mechanical fall Rhonda Smith was found to have an INR of 6.8, CT head showed a R side large SDH with significant mass effect and r uncal herniation. Neurology and Neurosurgery were consulted and given poor prognosis and family made decision of comfort measures  Final Diagnoses:  1. Large Subdural Hematoma with uncal herniation 2. A fib 3. Chronic CHF  4. Complete heart block    The results of significant diagnostics from this hospitalization (including imaging, microbiology, ancillary and laboratory) are listed below for reference.    Significant Diagnostic Studies: Dg Chest 2 View  Result Date: 10/12/2016 CLINICAL DATA:  Nausea and vomiting today.  Low-grade fever. EXAM: CHEST  2 VIEW COMPARISON:  05/10/2014 FINDINGS: Mild vascular and interstitial prominence, new. Small pleural effusions bilaterally. No dense confluent consolidation. Moderate cardiomegaly. Intact appearances of the transvenous cardiac leads. IMPRESSION: Vascular and interstitial changes suggest mild congestive heart failure. There also or small pleural effusions. No focal dense consolidation Electronically Signed   By: Andreas Newport M.D.   On: 10/12/2016 22:44   Dg Chest Portable 1 View  Result Date: 2016/10/23 CLINICAL DATA:  Mental status changes. EXAM: PORTABLE CHEST 1 VIEW COMPARISON:  10/12/2016 FINDINGS: 1741 hours. The cardio pericardial silhouette is enlarged. Left-sided permanent pacemaker again noted. Interstitial markings are diffusely coarsened with  chronic features. There is pulmonary vascular congestion without overt pulmonary edema. Left base atelectasis or infiltrate noted with probable tiny left pleural effusion. Bones are diffusely demineralized. Status post left shoulder replacement. Telemetry leads overlie the chest. IMPRESSION: Cardiomegaly with vascular congestion and left base atelectasis or infiltrate with small left pleural effusion. Electronically Signed   By: Misty Stanley M.D.   On: 2016/10/23 18:04   Dg Abd 2 Views  Result Date: 10/12/2016 CLINICAL DATA:  Nausea today. EXAM: ABDOMEN - 2 VIEW COMPARISON:  None. FINDINGS: The bowel gas pattern is normal. There is no evidence of free air. No radio-opaque calculi or other significant radiographic abnormality is seen. IMPRESSION: Negative. Electronically Signed   By: Andreas Newport M.D.   On: 10/12/2016 22:45   Ct Head Code Stroke Wo Contrast`  Result Date: Oct 23, 2016 CLINICAL DATA:  Code stroke.  Stroke-like symptoms. EXAM: CT HEAD WITHOUT CONTRAST TECHNIQUE: Contiguous axial images were obtained from the base of the skull through the vertex without intravenous contrast. COMPARISON:  05/07/2014 CT head. FINDINGS: Brain: Small acute hematoma within the right posterolateral temporal lobe. Large right cerebral convexity subdural hematoma measuring up to 20 mm in thickness with blood products extending over the falx and right greater than left tentorium. Significant associated mass effect on the underlying brain with right to left mass effect at level of septum pellucidum of 15 mm, right to left subfalcine herniation, effacement of basilar cisterns, and right-sided uncal herniation. Additionally there is hydrocephalus of left lateral ventricle likely due to entrapment. No large territory infarct to the brain is identified. There microvascular ischemic changes and small lacunar infarcts within the left lentiform nucleus stable from prior CT. Vascular: Calcific atherosclerosis of cavernous  internal carotid  arteries. Skull: Normal. Negative for fracture or focal lesion. Sinuses/Orbits: No acute finding. Other: None. IMPRESSION: 1. Large acute right convexity subdural hematoma and small parenchymal hemorrhage within the right posterior temporal lobe. 2. Severe mass effect with 20 mm right-to-left midline shift, effacement of basilar cisterns, right uncal herniation with compression of midbrain, and entrapped left lateral ventricle. These results were called by telephone at the time of interpretation on 11/04/2016 at 6:30 pm to Dr.Taqeer, who verbally acknowledged these results. Electronically Signed   By: Kristine Garbe M.D.   On: 11/08/2016 18:32    Microbiology: No results found for this or any previous visit (from the past 240 hour(s)).   Labs: Basic Metabolic Panel:  Recent Labs Lab 10/30/2016 1739 10/29/2016 1817  NA 131* 132*  K 4.2 4.1  CL 98* 97*  CO2 24  --   GLUCOSE 147* 155*  BUN 11 13  CREATININE 0.81 0.60  CALCIUM 8.6*  --    Liver Function Tests:  Recent Labs Lab 10/19/2016 1739  AST 41  ALT 20  ALKPHOS 90  BILITOT 2.4*  PROT 6.8  ALBUMIN 2.8*   No results for input(s): LIPASE, AMYLASE in the last 168 hours. No results for input(s): AMMONIA in the last 168 hours. CBC:  Recent Labs Lab 10/20/2016 1739 11/05/2016 1817  WBC 12.7*  --   NEUTROABS 7.5  --   HGB 13.8 14.3  HCT 40.5 42.0  MCV 95.5  --   PLT 164  --    Cardiac Enzymes: No results for input(s): CKTOTAL, CKMB, CKMBINDEX, TROPONINI in the last 168 hours. D-Dimer No results for input(s): DDIMER in the last 72 hours. BNP: Invalid input(s): POCBNP CBG: No results for input(s): GLUCAP in the last 168 hours. Anemia work up No results for input(s): VITAMINB12, FOLATE, FERRITIN, TIBC, IRON, RETICCTPCT in the last 72 hours. Urinalysis    Component Value Date/Time   COLORURINE YELLOW (A) 10/10/2015 1350   APPEARANCEUR CLEAR (A) 10/10/2015 1350   LABSPEC 1.010 10/10/2015 1350    PHURINE 7.0 10/10/2015 1350   GLUCOSEU NEGATIVE 10/10/2015 1350   HGBUR NEGATIVE 10/10/2015 1350   BILIRUBINUR NEGATIVE 10/10/2015 1350   KETONESUR NEGATIVE 10/10/2015 1350   PROTEINUR NEGATIVE 10/10/2015 1350   UROBILINOGEN 1.0 05/12/2014 1041   NITRITE NEGATIVE 10/10/2015 1350   LEUKOCYTESUR TRACE (A) 10/10/2015 1350   Sepsis Labs Invalid input(s): PROCALCITONIN,  WBC,  LACTICIDVEN    SIGNED:  Chipper Oman, MD  [facility] 11-17-16, 7:34 PM Pager   If 7PM-7AM, please contact night-coverage [email or web address] Password [email or web address]

## 2016-11-10 NOTE — Progress Notes (Signed)
Report received, time given to family in room. Post-mortem care completed and post-mortem flowsheet completed. Patient taken to morgue.

## 2016-11-10 DEATH — deceased

## 2016-11-11 ENCOUNTER — Encounter: Payer: Medicare Other | Admitting: Cardiology
